# Patient Record
Sex: Female | Born: 2000 | Race: Black or African American | Hispanic: No | Marital: Single | Smoking: Current some day smoker
Health system: Southern US, Community
[De-identification: ages and names within clinical notes are randomized; demographics above are authoritative.]

## PROBLEM LIST (undated history)

## (undated) DIAGNOSIS — A749 Chlamydial infection, unspecified: Secondary | ICD-10-CM

## (undated) DIAGNOSIS — A599 Trichomoniasis, unspecified: Secondary | ICD-10-CM

## (undated) DIAGNOSIS — F909 Attention-deficit hyperactivity disorder, unspecified type: Secondary | ICD-10-CM

## (undated) DIAGNOSIS — F913 Oppositional defiant disorder: Secondary | ICD-10-CM

## (undated) DIAGNOSIS — A549 Gonococcal infection, unspecified: Secondary | ICD-10-CM

## (undated) DIAGNOSIS — F319 Bipolar disorder, unspecified: Secondary | ICD-10-CM

## (undated) DIAGNOSIS — R440 Auditory hallucinations: Secondary | ICD-10-CM

## (undated) DIAGNOSIS — J45909 Unspecified asthma, uncomplicated: Secondary | ICD-10-CM

---
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
## 2018-01-16 ENCOUNTER — Encounter (HOSPITAL_COMMUNITY): Payer: Self-pay | Admitting: Emergency Medicine

## 2018-01-16 ENCOUNTER — Ambulatory Visit (HOSPITAL_COMMUNITY)
Admission: EM | Admit: 2018-01-16 | Discharge: 2018-01-16 | Disposition: A | Discharge status: Home / Self Care | Payer: Medicaid Other | Attending: Family Medicine | Admitting: Family Medicine

## 2018-01-16 DIAGNOSIS — Z3202 Encounter for pregnancy test, result negative: Secondary | ICD-10-CM

## 2018-01-16 DIAGNOSIS — Z113 Encounter for screening for infections with a predominantly sexual mode of transmission: Secondary | ICD-10-CM | POA: Diagnosis not present

## 2018-01-16 DIAGNOSIS — K5901 Slow transit constipation: Secondary | ICD-10-CM | POA: Diagnosis not present

## 2018-01-16 DIAGNOSIS — F913 Oppositional defiant disorder: Secondary | ICD-10-CM | POA: Insufficient documentation

## 2018-01-16 DIAGNOSIS — N938 Other specified abnormal uterine and vaginal bleeding: Secondary | ICD-10-CM | POA: Diagnosis not present

## 2018-01-16 DIAGNOSIS — F3160 Bipolar disorder, current episode mixed, unspecified: Secondary | ICD-10-CM | POA: Diagnosis not present

## 2018-01-16 DIAGNOSIS — K5900 Constipation, unspecified: Secondary | ICD-10-CM

## 2018-01-16 DIAGNOSIS — N939 Abnormal uterine and vaginal bleeding, unspecified: Secondary | ICD-10-CM | POA: Insufficient documentation

## 2018-01-16 DIAGNOSIS — G4733 Obstructive sleep apnea (adult) (pediatric): Secondary | ICD-10-CM | POA: Insufficient documentation

## 2018-01-16 DIAGNOSIS — F909 Attention-deficit hyperactivity disorder, unspecified type: Secondary | ICD-10-CM | POA: Insufficient documentation

## 2018-01-16 DIAGNOSIS — J45909 Unspecified asthma, uncomplicated: Secondary | ICD-10-CM | POA: Diagnosis not present

## 2018-01-16 DIAGNOSIS — Z79899 Other long term (current) drug therapy: Secondary | ICD-10-CM | POA: Insufficient documentation

## 2018-01-16 LAB — POCT I-STAT, CHEM 8
BUN: 10 mg/dL (ref 6–20)
CALCIUM ION: 1.15 mmol/L (ref 1.15–1.40)
CHLORIDE: 107 mmol/L (ref 101–111)
Creatinine, Ser: 0.7 mg/dL (ref 0.44–1.00)
Glucose, Bld: 92 mg/dL (ref 65–99)
HCT: 36 % (ref 36.0–46.0)
Hemoglobin: 12.2 g/dL (ref 12.0–15.0)
Potassium: 4.6 mmol/L (ref 3.5–5.1)
SODIUM: 141 mmol/L (ref 135–145)
TCO2: 23 mmol/L (ref 22–32)

## 2018-01-16 LAB — POCT URINALYSIS DIP (DEVICE)
Bilirubin Urine: NEGATIVE
GLUCOSE, UA: NEGATIVE mg/dL
Hgb urine dipstick: NEGATIVE
LEUKOCYTES UA: NEGATIVE
Nitrite: NEGATIVE
Protein, ur: NEGATIVE mg/dL
SPECIFIC GRAVITY, URINE: 1.025 (ref 1.005–1.030)
UROBILINOGEN UA: 2 mg/dL — AB (ref 0.0–1.0)
pH: 7 (ref 5.0–8.0)

## 2018-01-16 LAB — POCT PREGNANCY, URINE: PREG TEST UR: NEGATIVE

## 2018-01-16 MED ORDER — POLYETHYLENE GLYCOL 3350 17 GM/SCOOP PO POWD: 17.0000 g | Freq: Every day | ORAL | 0 refills | Status: DC

## 2018-01-16 NOTE — ED Provider Notes (Signed)
[facility]    CSN: [id number] Arrival date & time: 01/16/18  1959     History   Chief Complaint Chief Complaint  Patient presents with  . SEXUALLY TRANSMITTED DISEASE    HPI Rebekah Sanders is a 17 y.o. female.   Rebekah Sanders presents with requests for STD screen. She states "something isn't right" to vagina. Unknown if any discharge as she has frequent vaginal bleeding. This has been ongoing for over a year since her nexplanon was changed. She has had chlamydia in the past. States she has "1.5" partners currently. States she does use condoms. Bleeding has been more heavy than usual for her. Does not have a PCP or gynecologist. Denies  Urinary symptoms. Endorses constipation. No fevers. No vulvar sores or lesions. Occasional pelvic pressure. Hx of adhd, asthma, bipolar.    ROS per HPI.      Past Medical History:  Diagnosis Date  . ADHD (attention deficit hyperactivity disorder)   . Asthma   . Auditory hallucinations   . Bipolar 1 disorder (HCC)   . Oppositional defiant disorder     Patient Active Problem List   Diagnosis Date Noted  . OSA (obstructive sleep apnea) 05/26/2016  . Presence of subdermal contraceptive implant 03/13/2013  . Screening for STD (sexually transmitted disease) 03/13/2013  . Bipolar I disorder, most recent episode (or current) mixed, severe, without mention of psychotic behavior 08/08/2012    Class: Chronic  . ODD (oppositional defiant disorder) 08/08/2012    Class: Chronic  . ADHD (attention deficit hyperactivity disorder), combined type 08/08/2012    Past Surgical History:  Procedure Laterality Date  . TONSILLECTOMY AND ADENOIDECTOMY Bilateral 03/2016    OB History   None      Home Medications    Prior to Admission medications   Medication Sig Start Date End Date Taking? Authorizing Provider  etonogestrel (NEXPLANON) 68 MG IMPL implant 1 each by Subdermal route once.   Yes [provider]  megestrol (MEGACE) 40 MG  tablet Take 1 tablet (40 mg total) by mouth 2 (two) times daily. 10/25/17   Elvina Sidle, MD  metroNIDAZOLE (FLAGYL) 500 MG tablet Take 1 tablet (500 mg total) by mouth 2 (two) times daily. 10/27/17   Isa Rankin, MD  polyethylene glycol powder [facility]) powder Take 17 g by mouth daily. 01/16/18   Georgetta Haber, NP    Family History Family History  Problem Relation Age of Onset  . Bipolar disorder Unknown        Maternal grandparents  . Depression Unknown        Parents  . Drug abuse Unknown        multiple family members by report    Social History Social History   Tobacco Use  . Smoking status: Never Smoker  . Smokeless tobacco: Never Used  . Tobacco comment: father smokes in home  Substance Use Topics  . Alcohol use: No    Alcohol/week: 0.0 oz    Comment: Pt denies   . Drug use: No    Comment: Pt denies     Allergies   Penicillins   Review of Systems Review of Systems   Physical Exam Triage Vital Signs ED Triage Vitals [01/16/18 2018]  Enc Vitals Group     BP (!) 154/57     Pulse Rate 93     Resp 16     Temp 98.7 F (37.1 C)     Temp Source Oral     SpO2 100 %  Weight      Height      Head Circumference      Peak Flow      Pain Score 7     Pain Loc      Pain Edu?      Excl. in GC?    No data found.  Updated Vital Signs BP (!) 154/57   Pulse 93   Temp 98.7 F (37.1 C) (Oral)   Resp 16   SpO2 100%   Visual Acuity Right Eye Distance:   Left Eye Distance:   Bilateral Distance:    Right Eye Near:   Left Eye Near:    Bilateral Near:     Physical Exam  Constitutional: She is oriented to person, place, and time. She appears well-developed and well-nourished. No distress.  Cardiovascular: Normal rate, regular rhythm and normal heart sounds.  Pulmonary/Chest: Effort normal and breath sounds normal.  Abdominal: Soft. There is no tenderness.  Genitourinary:  Genitourinary Comments: Vaginal bleeding for over a year;  denies sores, lesions; discharge/itching; gu deferred at this time   Neurological: She is alert and oriented to person, place, and time.  Skin: Skin is warm and dry.     UC Treatments / Results  Labs (all labs ordered are listed, but only abnormal results are displayed) Labs Reviewed  RPR  HIV ANTIBODY (ROUTINE TESTING)  URINE CYTOLOGY ANCILLARY ONLY    EKG None  Radiology No results found.  Procedures Procedures (including critical care time)  Medications Ordered in UC Medications - No data to display  Initial Impression / Assessment and Plan / UC Course  I have reviewed the triage vital signs and the nursing notes.  Pertinent labs & imaging results that were available during my care of the patient were reviewed by me and considered in my medical decision making (see chart for details).     Chem 8 normal at this time, reassuring h&h with abnormal bleeding. Urine dip and pregnancy negative. Urine cytology pending as well as RPR/HIV. Will notify of any positive findings and if any changes to treatment are needed.  Establish with gynecology for recheck. Patient verbalized understanding and agreeable to plan.   Final Clinical Impressions(s) / UC Diagnoses   Final diagnoses:  Screen for STD (sexually transmitted disease)  Abnormal vaginal bleeding  Slow transit constipation     Discharge Instructions     Please use condoms to prevent STD's. We will notify you by phone of any positive findings from your urine or blood testing.  You may also check on your MyChart. Miralax as needed for constipation. Increase fluids and fiber in your diet to help with this.  Please follow up/establish with a gynecologist in regards to your frequent vaginal bleeding.    ED Prescriptions    Medication Sig Dispense Auth. Provider   polyethylene glycol powder (GLYCOLAX/MIRALAX) powder Take 17 g by mouth daily. 255 g Georgetta HaberBurky, Cadynce Garrette B, NP     Controlled Substance Prescriptions [place]  Controlled Substance Registry consulted? Not Applicable   Georgetta HaberBurky, Tranise Forrest B, NP 01/16/18 2101

## 2018-01-16 NOTE — ED Triage Notes (Signed)
PT requests STD check. PT reports constant menstrual since nexplanon was changed in April.

## 2018-01-16 NOTE — Discharge Instructions (Addendum)
Please use condoms to prevent STD's. We will notify you by phone of any positive findings from your urine or blood testing.  You may also check on your MyChart. Miralax as needed for constipation. Increase fluids and fiber in your diet to help with this.  Please follow up/establish with a gynecologist in regards to your frequent vaginal bleeding.

## 2018-01-17 ENCOUNTER — Telehealth (HOSPITAL_COMMUNITY): Payer: Self-pay

## 2018-01-17 LAB — URINE CYTOLOGY ANCILLARY ONLY
Chlamydia: NEGATIVE
Neisseria Gonorrhea: POSITIVE — AB
TRICH (WINDOWPATH): NEGATIVE

## 2018-01-17 LAB — RPR: RPR Ser Ql: NONREACTIVE

## 2018-01-17 LAB — HIV ANTIBODY (ROUTINE TESTING W REFLEX): HIV Screen 4th Generation wRfx: NONREACTIVE

## 2018-01-17 NOTE — Telephone Encounter (Signed)
Gonorrhea is positive.  Patient should return as soon as possible to the urgent care for treatment with IM rocephin 250mg  and po zithromax 1g. Patient will not need to see a provider unless there are new symptoms she would like evaluated. Need to educate patient to refrain from sexual intercourse for now and for 7 days after treatment to give the medicine time to work. Sexual partners need to be notified and tested/treated. Condoms may reduce risk of reinfection. Answered all patient questions. [facility] notified. Attempted to reach patient.  Number provided is not correct. Patient mailed letter and health department aware that the patient has not been notified or treated.

## 2018-01-19 LAB — URINE CYTOLOGY ANCILLARY ONLY: Candida vaginitis: NEGATIVE

## 2018-01-23 ENCOUNTER — Telehealth (HOSPITAL_COMMUNITY): Payer: Self-pay

## 2018-01-23 NOTE — Telephone Encounter (Signed)
Pt returned to clinic to get results. Pt is aware of positive Gonorrhea results and need for treatment. States she will come back this evening for treatment.

## 2018-01-24 ENCOUNTER — Encounter (HOSPITAL_COMMUNITY): Payer: Self-pay

## 2018-01-24 ENCOUNTER — Ambulatory Visit (HOSPITAL_COMMUNITY)
Admission: EM | Admit: 2018-01-24 | Discharge: 2018-01-24 | Disposition: A | Discharge status: Home / Self Care | Payer: Medicaid Other | Attending: Family Medicine | Admitting: Family Medicine

## 2018-01-24 DIAGNOSIS — A549 Gonococcal infection, unspecified: Secondary | ICD-10-CM

## 2018-01-24 MED ORDER — AZITHROMYCIN 250 MG PO TABS
ORAL_TABLET | ORAL | Status: AC
  Filled 2018-01-24: qty 4

## 2018-01-24 MED ORDER — CEFTRIAXONE SODIUM 250 MG IJ SOLR
250.0000 mg | Freq: Once | INTRAMUSCULAR | Status: AC
  Administered 2018-01-24: 250 mg via INTRAMUSCULAR

## 2018-01-24 MED ORDER — LIDOCAINE HCL (PF) 1 % IJ SOLN
INTRAMUSCULAR | Status: AC
  Filled 2018-01-24: qty 2

## 2018-01-24 MED ORDER — CEFTRIAXONE SODIUM 250 MG IJ SOLR
INTRAMUSCULAR | Status: AC
  Filled 2018-01-24: qty 250

## 2018-01-24 MED ORDER — AZITHROMYCIN 250 MG PO TABS
1000.0000 mg | ORAL_TABLET | Freq: Once | ORAL | Status: AC
  Administered 2018-01-24: 1000 mg via ORAL

## 2018-01-24 NOTE — ED Triage Notes (Signed)
Pt presents to get treatment for STD Screening

## 2018-01-25 ENCOUNTER — Ambulatory Visit: Payer: Medicaid Other | Admitting: Family

## 2018-02-21 ENCOUNTER — Encounter: Payer: Medicaid Other | Admitting: Licensed Clinical Social Worker

## 2018-02-21 ENCOUNTER — Ambulatory Visit: Payer: Medicaid Other | Admitting: Pediatrics

## 2018-02-21 NOTE — ED Provider Notes (Signed)
[facility]    CSN: [id number] Arrival date & time: 01/24/18  1458     History   Chief Complaint Chief Complaint  Patient presents with  . Follow Up for STD results, just getting medication    HPI Rebekah Sanders is a 17 y.o. female.   Patient is here for treatment of positive cytologies.  This is a nurse visit.  HPI  Past Medical History:  Diagnosis Date  . ADHD (attention deficit hyperactivity disorder)   . Asthma   . Auditory hallucinations   . Bipolar 1 disorder (HCC)   . Oppositional defiant disorder     Patient Active Problem List   Diagnosis Date Noted  . OSA (obstructive sleep apnea) 05/26/2016  . Presence of subdermal contraceptive implant 03/13/2013  . Screening for STD (sexually transmitted disease) 03/13/2013  . Bipolar I disorder, most recent episode (or current) mixed, severe, without mention of psychotic behavior 08/08/2012    Class: Chronic  . ODD (oppositional defiant disorder) 08/08/2012    Class: Chronic  . ADHD (attention deficit hyperactivity disorder), combined type 08/08/2012    Past Surgical History:  Procedure Laterality Date  . TONSILLECTOMY AND ADENOIDECTOMY Bilateral 03/2016    OB History   None      Home Medications    Prior to Admission medications   Medication Sig Start Date End Date Taking? Authorizing Provider  etonogestrel (NEXPLANON) 68 MG IMPL implant 1 each by Subdermal route once.   Yes [provider]  megestrol (MEGACE) 40 MG tablet Take 1 tablet (40 mg total) by mouth 2 (two) times daily. 10/25/17   Elvina Sidle, MD  metroNIDAZOLE (FLAGYL) 500 MG tablet Take 1 tablet (500 mg total) by mouth 2 (two) times daily. 10/27/17   Isa Rankin, MD  polyethylene glycol powder [facility]) powder Take 17 g by mouth daily. 01/16/18   Georgetta Haber, NP    Family History Family History  Problem Relation Age of Onset  . Bipolar disorder Unknown        Maternal grandparents  . Depression  Unknown        Parents  . Drug abuse Unknown        multiple family members by report    Social History Social History   Tobacco Use  . Smoking status: Never Smoker  . Smokeless tobacco: Never Used  . Tobacco comment: father smokes in home  Substance Use Topics  . Alcohol use: No    Alcohol/week: 0.0 oz    Comment: Pt denies   . Drug use: No    Comment: Pt denies     Allergies   Penicillins   Review of Systems Review of Systems   Physical Exam Triage Vital Signs ED Triage Vitals [01/24/18 1511]  Enc Vitals Group     BP      Pulse      Resp      Temp      Temp src      SpO2      Weight      Height      Head Circumference      Peak Flow      Pain Score 0     Pain Loc      Pain Edu?      Excl. in GC?    No data found.  Updated Vital Signs There were no vitals taken for this visit.  Visual Acuity Right Eye Distance:   Left Eye Distance:   Bilateral  Distance:    Right Eye Near:   Left Eye Near:    Bilateral Near:     Physical Exam   UC Treatments / Results  Labs (all labs ordered are listed, but only abnormal results are displayed) Labs Reviewed - No data to display  EKG None  Radiology No results found.  Procedures Procedures (including critical care time)  Medications Ordered in UC Medications  cefTRIAXone (ROCEPHIN) injection 250 mg (250 mg Intramuscular Given 01/24/18 1525)  azithromycin (ZITHROMAX) tablet 1,000 mg (1,000 mg Oral Given 01/24/18 1524)    Initial Impression / Assessment and Plan / UC Course  I have reviewed the triage vital signs and the nursing notes.  Pertinent labs & imaging results that were available during my care of the patient were reviewed by me and considered in my medical decision making (see chart for details).     Gonorrhea, proven by culture Final Clinical Impressions(s) / UC Diagnoses   Final diagnoses:  None   Discharge Instructions   None    ED Prescriptions    None     Controlled  Substance Prescriptions [place] Controlled Substance Registry consulted? No   Frederica KusterMiller, Mishal Probert M, MD 02/21/18 [id number]

## 2018-02-22 ENCOUNTER — Ambulatory Visit: Payer: Medicaid Other | Admitting: Family

## 2018-03-02 ENCOUNTER — Encounter: Payer: Self-pay | Admitting: Pediatrics

## 2018-03-02 ENCOUNTER — Ambulatory Visit (INDEPENDENT_AMBULATORY_CARE_PROVIDER_SITE_OTHER): Payer: Medicaid Other | Admitting: Pediatrics

## 2018-03-02 VITALS — BP 101/62 | HR 83 | Ht 66.93 in | Wt 193.8 lb

## 2018-03-02 DIAGNOSIS — R790 Abnormal level of blood mineral: Secondary | ICD-10-CM | POA: Diagnosis not present

## 2018-03-02 DIAGNOSIS — Z3046 Encounter for surveillance of implantable subdermal contraceptive: Secondary | ICD-10-CM | POA: Diagnosis not present

## 2018-03-02 DIAGNOSIS — F3163 Bipolar disorder, current episode mixed, severe, without psychotic features: Secondary | ICD-10-CM

## 2018-03-02 DIAGNOSIS — Z8619 Personal history of other infectious and parasitic diseases: Secondary | ICD-10-CM

## 2018-03-02 DIAGNOSIS — Z113 Encounter for screening for infections with a predominantly sexual mode of transmission: Secondary | ICD-10-CM

## 2018-03-02 DIAGNOSIS — L700 Acne vulgaris: Secondary | ICD-10-CM | POA: Diagnosis not present

## 2018-03-02 DIAGNOSIS — L739 Follicular disorder, unspecified: Secondary | ICD-10-CM

## 2018-03-02 MED ORDER — FERROUS SULFATE 325 (65 FE) MG PO TABS: 325.0000 mg | ORAL_TABLET | Freq: Two times a day (BID) | ORAL | 3 refills | Status: DC

## 2018-03-02 MED ORDER — CLINDAMYCIN PHOS-BENZOYL PEROX 1-5 % EX GEL: Freq: Two times a day (BID) | CUTANEOUS | 3 refills | Status: DC

## 2018-03-02 MED ORDER — ADAPALENE 0.1 % EX CREA: TOPICAL_CREAM | Freq: Every day | CUTANEOUS | 3 refills | Status: DC

## 2018-03-02 NOTE — Progress Notes (Signed)
History was provided by the patient.  Rebekah LeatherwoodVicky Sanders is a 17 y.o. female who is here for nexplanon removal, STI testing.  Patient, No Pcp Per   HPI:  Pt reports that she wants to take her birth control out. She feels that it is painful at the site. Says that mom and sister say that pills are better. She has had quite a bit of bleeding with her nexplanon as well, which she has not liked. She feels that she can take the pills daily and really does not want to be pregnant. She has not considered an IUD but in discussion feels anxious about the placement.   Says that her boyfriend of 2 months has noted that she needs to be seen for her mood. She can't say what he finds about her mood that is concerning, but is willing to bring him back for a f/u visit next week with her to discuss.   She would like some cream for her acne- has been using sister's benzaclin and differin which has been working well.   Has folliculitis from shaving in vaginal area. She got treated for gonorrhea in June and reports she is no longer with that partner. She was positive for trich in April and can't remember exactly if she completed treatment for this. Requests repeat testing today. Denies current sx.    No LMP recorded. Patient has had an implant.  Review of Systems  Constitutional: Positive for malaise/fatigue.  Eyes: Negative for double vision.  Respiratory: Negative for shortness of breath.   Cardiovascular: Negative for chest pain and palpitations.  Gastrointestinal: Negative for abdominal pain, constipation, diarrhea, nausea and vomiting.  Genitourinary: Negative for dysuria.  Musculoskeletal: Negative for joint pain and myalgias.  Skin: Negative for rash.  Neurological: Negative for dizziness and headaches.  Endo/Heme/Allergies: Does not bruise/bleed easily.  Psychiatric/Behavioral: Negative for depression. The patient is not nervous/anxious and does not have insomnia.     Patient Active Problem List   Diagnosis Date Noted  . OSA (obstructive sleep apnea) 05/26/2016  . Presence of subdermal contraceptive implant 03/13/2013  . Screening for STD (sexually transmitted disease) 03/13/2013  . Bipolar I disorder, most recent episode (or current) mixed, severe, without mention of psychotic behavior 08/08/2012    Class: Chronic  . ODD (oppositional defiant disorder) 08/08/2012    Class: Chronic  . ADHD (attention deficit hyperactivity disorder), combined type 08/08/2012    Current Outpatient Medications on File Prior to Visit  Medication Sig Dispense Refill  . etonogestrel (NEXPLANON) 68 MG IMPL implant 1 each by Subdermal route once.    . megestrol (MEGACE) 40 MG tablet Take 1 tablet (40 mg total) by mouth 2 (two) times daily. (Patient not taking: Reported on 03/02/2018) 14 tablet 0  . metroNIDAZOLE (FLAGYL) 500 MG tablet Take 1 tablet (500 mg total) by mouth 2 (two) times daily. (Patient not taking: Reported on 03/02/2018) 14 tablet 0  . polyethylene glycol powder (GLYCOLAX/MIRALAX) powder Take 17 g by mouth daily. (Patient not taking: Reported on 03/02/2018) 255 g 0   No current facility-administered medications on file prior to visit.     Allergies  Allergen Reactions  . Penicillins Hives     Physical Exam:    Vitals:   03/02/18 1526  BP: 101/62  Pulse: 83  Weight: 193 lb 12.8 oz (87.9 kg)  Height: 5' 6.93" (1.7 m)    Blood pressure percentiles are not available for patients who are 18 years or older.  Physical Exam  Constitutional:  She appears well-developed. No distress.  HENT:  Mouth/Throat: Oropharynx is clear and moist.  Neck: No thyromegaly present.  Cardiovascular: Normal rate and regular rhythm.  No murmur heard. Pulmonary/Chest: Breath sounds normal.  Abdominal: Soft. She exhibits no mass. There is no tenderness. There is no guarding.  Musculoskeletal: She exhibits no edema.  Lymphadenopathy:    She has no cervical adenopathy.  Neurological: She is alert.  Skin:  Skin is warm. No rash noted.  Psychiatric: She has a normal mood and affect.  Nursing note and vitals reviewed.   Assessment/Plan: 1. Encounter for Nexplanon removal See procedure note for removal. Tolerated well. OCP sent to pharmacy and provided handout about how to take appropriately.   2. Severe bipolar I disorder, current or most recent episode mixed (HCC) Will return next week for further discussion. Also needs to get established with an adult provider. Provided family medicine phone number. Discussed she can continue to see me until she gets established, but should stop using ED and urgent care so frequently for STI testing.   3. Low ferritin Last check of ferrintin was 2. She never took iron tablets. She continues to complain of fatigue. Iron BID. Discussed dark stools and constipation as potential s/e. Will recheck if she continues good compliance.  - ferrous sulfate (FERROUSUL) 325 (65 FE) MG tablet; Take 1 tablet (325 mg total) by mouth 2 (two) times daily with a meal.  Dispense: 60 tablet; Refill: 3  4. Acne vulgaris rx for acne cream. Discussed that OCP may also help.  - clindamycin-benzoyl peroxide (BENZACLIN) gel; Apply topically 2 (two) times daily.  Dispense: 50 g; Refill: 3 - adapalene (DIFFERIN) 0.1 % cream; Apply topically at bedtime.  Dispense: 45 g; Refill: 3  5. Folliculitis Can use benzaclin for this as well. Denies any s/sx that sound like herpes. If still present and bothersome next week, will look at potentially swab.  - clindamycin-benzoyl peroxide (BENZACLIN) gel; Apply topically 2 (two) times daily.  Dispense: 50 g; Refill: 3  6. History of trichomoniasis Repeat swab today.  - WET PREP BY MOLECULAR PROBE  7. Routine screening for STI (sexually transmitted infection) Repeat testing today.  - C. trachomatis/N. gonorrhoeae RNA

## 2018-03-02 NOTE — Patient Instructions (Addendum)
[facility]  [phone] Please call and make an appointment for primary care.  If you need to be seen for a mood symptom, vaginal symptom or birth control concern, you can come here and see [place] in red pod. Please activate mychart so you can send [place] a message with your concern and we can get you scheduled appropriately. Try to avoid using the ER and urgent care when possible.   For razor bumps- 1. Use witch hazel after you shave and daily to help prevent razor bumps. 2. Use some sort of exfoliation in the shower- sugar scrub or similar. 3. Use a new disposable razor every time you shave. 4. Use the benzaclin cream on the area to help prevent bumps.   Take iron twice a day with food. It may make your stools black.   Come back in 1 week and let's talk about your mood symptoms.   Follow-up with Dr. Marina Goodell in 1 month. Schedule this appointment before you leave clinic today.  Your Nexplanon was removed today and is no longer preventing pregnancy.  If you have sex, remember to use condoms to prevent pregnancy and to prevent sexually transmitted infections.  Leave the outside bandage on for 24 hours.  Leave the smaller bandages on for 3-5 days or until they fall off on their own.  Keep the area clean and dry for 3-5 days.  There is usually bruising or swelling at and around the removal site for a few days to a week after the removal.  If you see redness or pus draining from the removal site, call us immediately.  We would like you to return to the clinic for a follow-up visit in 1 month.  You can call [facility] 24 hours a day with any questions or concerns.  There is always a nurse or doctor available to take your call.  Call 9-1-1 if you have a life-threatening emergency.  For anything else, please call us at [phone] before heading to the ER.  Oral Contraception Use Oral contraceptive pills (OCPs) are medicines taken to prevent pregnancy. OCPs work by  preventing the ovaries from releasing eggs. The hormones in OCPs also cause the cervical mucus to thicken, preventing the sperm from entering the uterus. The hormones also cause the uterine lining to become thin, not allowing a fertilized egg to attach to the inside of the uterus. OCPs are highly effective when taken exactly as prescribed. However, OCPs do not prevent sexually transmitted diseases (STDs). Safe sex practices, such as using condoms along with an OCP, can help prevent STDs. Before taking OCPs, you may have a physical exam and Pap test. Your health care provider may also order blood tests if necessary. Your health care provider will make sure you are a good candidate for oral contraception. Discuss with your health care provider the possible side effects of the OCP you may be prescribed. When starting an OCP, it can take 2 to 3 months for the body to adjust to the changes in hormone levels in your body. How to take oral contraceptive pills Your health care provider may advise you on how to start taking the first cycle of OCPs. Otherwise, you can:  Start on day 1 of your menstrual period. You will not need any backup contraceptive protection with this start time.  Start on the first Sunday after your menstrual period or the day you get your prescription. In these cases, you will need to use backup  contraceptive protection for the first week.  Start the pill at any time of your cycle. If you take the pill within 5 days of the start of your period, you are protected against pregnancy right away. In this case, you will not need a backup form of birth control. If you start at any other time of your menstrual cycle, you will need to use another form of birth control for 7 days. If your OCP is the type called a minipill, it will protect you from pregnancy after taking it for 2 days (48 hours).  After you have started taking OCPs:  If you forget to take 1 pill, take it as soon as you remember. Take  the next pill at the regular time.  If you miss 2 or more pills, call your health care provider because different pills have different instructions for missed doses. Use backup birth control until your next menstrual period starts.  If you use a 28-day pack that contains inactive pills and you miss 1 of the last 7 pills (pills with no hormones), it will not matter. Throw away the rest of the non-hormone pills and start a new pill pack.  No matter which day you start the OCP, you will always start a new pack on that same day of the week. Have an extra pack of OCPs and a backup contraceptive method available in case you miss some pills or lose your OCP pack. Follow these instructions at home:  Do not smoke.  Always use a condom to protect against STDs. OCPs do not protect against STDs.  Use a calendar to mark your menstrual period days.  Read the information and directions that came with your OCP. Talk to your health care provider if you have questions. Contact a health care provider if:  You develop nausea and vomiting.  You have abnormal vaginal discharge or bleeding.  You develop a rash.  You miss your menstrual period.  You are losing your hair.  You need treatment for mood swings or depression.  You get dizzy when taking the OCP.  You develop acne from taking the OCP.  You become pregnant. Get help right away if:  You develop chest pain.  You develop shortness of breath.  You have an uncontrolled or severe headache.  You develop numbness or slurred speech.  You develop visual problems.  You develop pain, redness, and swelling in the legs. This information is not intended to replace advice given to you by your health care provider. Make sure you discuss any questions you have with your health care provider. Document Released: 07/01/2011 Document Revised: 12/18/2015 Document Reviewed: 12/31/2012 Elsevier Interactive Patient Education  2017 [employer]

## 2018-03-02 NOTE — Progress Notes (Signed)
Patient cell phone [phone].

## 2018-03-03 DIAGNOSIS — L700 Acne vulgaris: Secondary | ICD-10-CM | POA: Insufficient documentation

## 2018-03-03 DIAGNOSIS — R790 Abnormal level of blood mineral: Secondary | ICD-10-CM | POA: Insufficient documentation

## 2018-03-03 DIAGNOSIS — Z8619 Personal history of other infectious and parasitic diseases: Secondary | ICD-10-CM | POA: Insufficient documentation

## 2018-03-03 LAB — C. TRACHOMATIS/N. GONORRHOEAE RNA
C. TRACHOMATIS RNA, TMA: NOT DETECTED
N. GONORRHOEAE RNA, TMA: NOT DETECTED

## 2018-03-03 MED ORDER — NORETHIN ACE-ETH ESTRAD-FE 1.5-30 MG-MCG PO TABS: 1.0000 | ORAL_TABLET | Freq: Every day | ORAL | 11 refills | Status: DC

## 2018-03-03 NOTE — Progress Notes (Signed)

## 2018-03-04 LAB — WET PREP BY MOLECULAR PROBE
CANDIDA SPECIES: NOT DETECTED
MICRO NUMBER:: 90945860
SPECIMEN QUALITY:: ADEQUATE
Trichomonas vaginosis: DETECTED — AB

## 2018-03-07 ENCOUNTER — Encounter: Payer: Medicaid Other | Admitting: Clinical

## 2018-03-07 ENCOUNTER — Ambulatory Visit: Payer: Medicaid Other | Admitting: Pediatrics

## 2018-03-07 NOTE — BH Specialist Note (Deleted)
Integrated Behavioral Health Initial Visit  MRN: [id number] Name: Rebekah Sanders  Number of Integrated Behavioral Health Clinician visits:: 1/6 Session Start time: ***  Session End time: *** Total time: {IBH Total Time:21014050}  Type of Service: Integrated Behavioral Health- Individual/Family Interpretor:{yes NU:[id number]}no:[id number]} Interpretor Name and Language: ***   Warm Hand Off Completed.       SUBJECTIVE: Rebekah Sanders is a 17 y.o. female accompanied by {CHL AMB ACCOMPANIED UY:[id number]}BY:[phone]} Patient was referred by *** for ***. Patient reports the following symptoms/concerns: *** Duration of problem: ***; Severity of problem: {Mild/Moderate/Severe:20260}  OBJECTIVE: Mood: {BHH MOOD:22306} and Affect: {BHH AFFECT:22307} Risk of harm to self or others: {CHL AMB BH Suicide Current Mental Status:21022748}  LIFE CONTEXT: Family and Social: *** School/Work: *** Self-Care: *** Life Changes: ***  GOALS ADDRESSED: Patient will: 1. Reduce symptoms of: {IBH Symptoms:21014056} 2. Increase knowledge and/or ability of: {IBH Patient Tools:21014057}  3. Demonstrate ability to: {IBH Goals:21014053}  INTERVENTIONS: Interventions utilized: {IBH Interventions:21014054}  Standardized Assessments completed: {IBH Screening Tools:21014051}  ASSESSMENT: Patient currently experiencing ***.   Patient may benefit from ***.  PLAN: 1. Follow up with behavioral health clinician on : *** 2. Behavioral recommendations: *** 3. Referral(s): {IBH Referrals:21014055} 4. "From scale of 1-10, how likely are you to follow plan?": ***  Gordy SaversJasmine P Williams, LCSW

## 2018-03-08 ENCOUNTER — Other Ambulatory Visit: Payer: Self-pay | Admitting: Pediatrics

## 2018-03-08 MED ORDER — METRONIDAZOLE 500 MG PO TABS: 500.0000 mg | ORAL_TABLET | Freq: Two times a day (BID) | ORAL | 0 refills | Status: AC

## 2018-03-08 MED ORDER — NORETHIN ACE-ETH ESTRAD-FE 1.5-30 MG-MCG PO TABS: 1.0000 | ORAL_TABLET | Freq: Every day | ORAL | 11 refills | Status: DC

## 2018-03-08 NOTE — Progress Notes (Signed)
Patient called to inquire about missed call. Also wanted her BCP sent to [facility] on [address].  Gave her results as per previous notes. Patient elected to have her medicine called in to pharmacy and NOT come to clinic. Patient was made aware that we could not treat partner if she did not come to clinic. Told her I would give information to provider and verified her phone number.

## 2018-03-08 NOTE — Progress Notes (Signed)
TC to patient. Resent birth  Control to preferred pharmacy and explained treatment for trich. I was unable to LVM.

## 2018-03-15 ENCOUNTER — Telehealth: Payer: Self-pay | Admitting: General Practice

## 2018-03-15 NOTE — Telephone Encounter (Signed)
Called number on file, no answer, left VM to call office back. ° °

## 2018-03-15 NOTE — Telephone Encounter (Signed)
This is not an emergency matter. Rebekah Sanders did not come back for her last appointment to discuss her mood concerns. Has she picked up medication for trich? She will need to be seen in the office to discuss this matter as we have never treated her for mood in the past.

## 2018-03-15 NOTE — Telephone Encounter (Signed)
Patient called and stated she would like Solwayhristy or [place] CitizenCaroline to please call her, somewhat an emergency. She needs a letter stating her dog is a therapy dog.

## 2018-03-20 NOTE — Telephone Encounter (Signed)
Patient called again to discuss construction of letter. Explained she would need to be seen in the office to follow up on mood before anything can be constructed. Gave soonest avail pm appointment and explained repercussion of no show. Also spoke with her on STI treatment. Gave medication treatment instructions. She has not picked up medication but plans to get it today from [facility] [address].

## 2018-03-21 ENCOUNTER — Ambulatory Visit (HOSPITAL_COMMUNITY)
Admission: EM | Admit: 2018-03-21 | Discharge: 2018-03-21 | Disposition: A | Discharge status: Home / Self Care | Payer: Medicaid Other | Attending: Family Medicine | Admitting: Family Medicine

## 2018-03-21 ENCOUNTER — Encounter (HOSPITAL_COMMUNITY): Payer: Self-pay

## 2018-03-21 DIAGNOSIS — K5900 Constipation, unspecified: Secondary | ICD-10-CM

## 2018-03-21 LAB — POCT PREGNANCY, URINE: Preg Test, Ur: NEGATIVE

## 2018-03-21 MED ORDER — POLYETHYLENE GLYCOL 3350 17 GM/SCOOP PO POWD: 1.0000 | Freq: Once | ORAL | 0 refills | Status: AC

## 2018-03-21 MED ORDER — MAGNESIUM CITRATE PO SOLN: 1.0000 | Freq: Once | ORAL | 1 refills | Status: AC

## 2018-03-21 NOTE — ED Provider Notes (Signed)
[facility]    CSN: [id number] Arrival date & time: 03/21/18  1456     History   Chief Complaint Chief Complaint  Patient presents with  . Abdominal Pain    HPI Rebekah Sanders is a 17 y.o. female.   Pt is a 17 year old female past medical history of ADHD, asthma, bipolar, trichomoniasis, gonorrhea, chlamydia. She presents with left-sided abdominal pain and left lower quadrant pain x1 week.  The problem has been waxing and waning.  It has worsened.  She had a BM this morning that was small and hard.  She does have a history of constipation.  Takes iron supplement.  She denies any rectal pain, rectal bleeding. She denies any nausea vomiting or diarrhea.  She denies any fever, chills, dysuria, hematuria, vaginal discharge, vaginal bleeding. LMP was 03/03/18 and normal. She is on OCs.   She does not smoke Family hx of bipolar, depression and drug use.    ROS per HPI       Past Medical History:  Diagnosis Date  . ADHD (attention deficit hyperactivity disorder)   . Asthma   . Auditory hallucinations   . Bipolar 1 disorder (HCC)   . Oppositional defiant disorder     Patient Active Problem List   Diagnosis Date Noted  . Low ferritin 03/03/2018  . Acne vulgaris 03/03/2018  . History of trichomoniasis 03/03/2018  . History of gonorrhea 03/03/2018  . History of chlamydia 03/03/2018  . OSA (obstructive sleep apnea) 05/26/2016  . Screening for STD (sexually transmitted disease) 03/13/2013  . Severe bipolar I disorder, current or most recent episode mixed (HCC) 08/08/2012    Class: Chronic  . ODD (oppositional defiant disorder) 08/08/2012    Class: Chronic  . ADHD (attention deficit hyperactivity disorder), combined type 08/08/2012    Past Surgical History:  Procedure Laterality Date  . TONSILLECTOMY AND ADENOIDECTOMY Bilateral 03/2016    OB History   None      Home Medications    Prior to Admission medications   Medication Sig Start Date End Date  Taking? Authorizing Provider  adapalene (DIFFERIN) 0.1 % cream Apply topically at bedtime. 03/02/18   Verneda Skill, FNP  clindamycin-benzoyl peroxide (BENZACLIN) gel Apply topically 2 (two) times daily. 03/02/18   Verneda Skill, FNP  etonogestrel (NEXPLANON) 68 MG IMPL implant 1 each by Subdermal route once.    [provider]  ferrous sulfate (FERROUSUL) 325 (65 FE) MG tablet Take 1 tablet (325 mg total) by mouth 2 (two) times daily with a meal. 03/02/18   Verneda Skill, FNP  magnesium citrate SOLN Take 296 mLs (1 Bottle total) by mouth once for 1 dose. 03/21/18 03/21/18  Dahlia Byes A, NP  norethindrone-ethinyl estradiol-iron (JUNEL FE 1.5/30) 1.5-30 MG-MCG tablet Take 1 tablet by mouth daily. 03/08/18   Verneda Skill, FNP  polyethylene glycol powder (MIRALAX) powder Take 255 g by mouth once for 1 dose. 03/21/18 03/21/18  Janace Aris, NP    Family History Family History  Problem Relation Age of Onset  . Bipolar disorder Unknown        Maternal grandparents  . Depression Unknown        Parents  . Drug abuse Unknown        multiple family members by report    Social History Social History   Tobacco Use  . Smoking status: Never Smoker  . Smokeless tobacco: Never Used  . Tobacco comment: father smokes in home  Substance Use Topics  .  Alcohol use: No    Alcohol/week: 0.0 standard drinks    Comment: Pt denies   . Drug use: No    Comment: Pt denies     Allergies   Penicillins   Review of Systems Review of Systems   Physical Exam Triage Vital Signs ED Triage Vitals [03/21/18 1515]  Enc Vitals Group     BP 126/79     Pulse Rate 95     Resp 20     Temp 98.4 F (36.9 C)     Temp Source Oral     SpO2 98 %     Weight      Height      Head Circumference      Peak Flow      Pain Score      Pain Loc      Pain Edu?      Excl. in GC?    No data found.  Updated Vital Signs BP 126/79 (BP Location: Right Arm)   Pulse 95   Temp 98.4 F (36.9 C)  (Oral)   Resp 20   LMP 03/03/2018   SpO2 98%   Visual Acuity Right Eye Distance:   Left Eye Distance:   Bilateral Distance:    Right Eye Near:   Left Eye Near:    Bilateral Near:     Physical Exam  Constitutional: She appears well-developed and well-nourished.  Very pleasant nontoxic or ill-appearing.  HENT:  Head: Normocephalic and atraumatic.  Pulmonary/Chest: Effort normal.  Abdominal: Soft. Normal appearance and bowel sounds are normal. There is tenderness in the left upper quadrant and left lower quadrant. There is no rebound and no CVA tenderness.  Soft, nontender.  No masses.  Negative rebound.  Tender to palpation of left upper quadrant and left lower quadrant.  Some fullness felt.   Neurological: She is alert.  Skin: Skin is warm and dry. Capillary refill takes less than 2 seconds.  Psychiatric: She has a normal mood and affect.  Nursing note and vitals reviewed.    UC Treatments / Results  Labs (all labs ordered are listed, but only abnormal results are displayed) Labs Reviewed  POCT PREGNANCY, URINE    EKG None  Radiology No results found.  Procedures Procedures (including critical care time)  Medications Ordered in UC Medications - No data to display  Initial Impression / Assessment and Plan / UC Course  I have reviewed the triage vital signs and the nursing notes.  Pertinent labs & imaging results that were available during my care of the patient were reviewed by me and considered in my medical decision making (see chart for details).     Most likely constipation Urine negative for infection or pregnancy. Will treat with mag citrate and daily MiraLax.  If no relief from this she may want to try an enema. Follow up as needed for continued or worsening symptoms  Final Clinical Impressions(s) / UC Diagnoses   Final diagnoses:  Constipation, unspecified constipation type     Discharge Instructions     It was nice meeting you!!  I Believe  that your abdominal pain is caused by constipation.  I am sending the mag citrate to your pharmacy Please consume the whole bottle.  You can continue daily MiraLAX after that to maintain bowel movements. If your bowel movements become too frequent or more loose you can decrease to few times a week. He can also try an enema over-the-counter if you get no relief from the  mag citrate. If you do not have a bowel movement or develop more severe abdominal pain, nausea, vomiting please go to the ER.      ED Prescriptions    Medication Sig Dispense Auth. Provider   magnesium citrate SOLN Take 296 mLs (1 Bottle total) by mouth once for 1 dose. 195 mL Nedda Gains A, NP   polyethylene glycol powder (MIRALAX) powder Take 255 g by mouth once for 1 dose. 255 g Dahlia ByesBast, Marquinn Meschke A, NP     Controlled Substance Prescriptions [place] Controlled Substance Registry consulted? Not Applicable   Janace ArisBast, Virgel Haro A, NP 03/21/18 1605

## 2018-03-21 NOTE — Discharge Instructions (Addendum)
It was nice meeting you!!  I Believe that your abdominal pain is caused by constipation.  I am sending the mag citrate to your pharmacy Please consume the whole bottle.  You can continue daily MiraLAX after that to maintain bowel movements. If your bowel movements become too frequent or more loose you can decrease to few times a week. He can also try an enema over-the-counter if you get no relief from the mag citrate. If you do not have a bowel movement or develop more severe abdominal pain, nausea, vomiting please go to the ER.

## 2018-03-21 NOTE — ED Triage Notes (Signed)
Pt presents with abdominal pain in lower part of abdominal area

## 2018-04-05 ENCOUNTER — Ambulatory Visit: Payer: Medicaid Other | Admitting: Family

## 2018-04-24 ENCOUNTER — Ambulatory Visit: Payer: Medicaid Other | Admitting: Pediatrics

## 2018-09-04 ENCOUNTER — Ambulatory Visit (HOSPITAL_COMMUNITY)
Admission: EM | Admit: 2018-09-04 | Discharge: 2018-09-04 | Disposition: A | Discharge status: Home / Self Care | Payer: Self-pay | Attending: Family Medicine | Admitting: Family Medicine

## 2018-09-04 ENCOUNTER — Encounter (HOSPITAL_COMMUNITY): Payer: Self-pay | Admitting: Emergency Medicine

## 2018-09-04 DIAGNOSIS — Z113 Encounter for screening for infections with a predominantly sexual mode of transmission: Secondary | ICD-10-CM | POA: Insufficient documentation

## 2018-09-04 DIAGNOSIS — R21 Rash and other nonspecific skin eruption: Secondary | ICD-10-CM

## 2018-09-04 MED ORDER — TRIAMCINOLONE ACETONIDE 0.1 % EX CREA: 1.0000 "application " | TOPICAL_CREAM | Freq: Two times a day (BID) | CUTANEOUS | 0 refills | Status: DC

## 2018-09-04 MED ORDER — PREDNISONE 50 MG PO TABS: 50.0000 mg | ORAL_TABLET | Freq: Every day | ORAL | 0 refills | Status: DC

## 2018-09-04 NOTE — Discharge Instructions (Signed)
Start prednisone and triamcinolone to help with symptoms. As discussed, the scaring may not resolve given length of symptoms. Follow up with dermatology for further evaluation if symptoms not improving.  Cytology sent, you will be contacted with any positive results that requires further treatment. Refrain from sexual activity for the next 7 days.

## 2018-09-04 NOTE — ED Provider Notes (Signed)
[facility]    CSN: [id number] Arrival date & time: 09/04/18  6720     History   Chief Complaint Chief Complaint  Patient presents with  . Rash  . SEXUALLY TRANSMITTED DISEASE    HPI Rebekah Sanders is a 18 y.o. female.   18 year old female comes in for multiple complaints.  States she has had rash and itching to the tattoo she got since 04/2018. States she now has rash/bumps to the tattoo. Denies spreading rash. Denies spreading erythema, warmth, fever. Denies pain. She has been scratching area. Has not tried anything for the symptoms.  She is requesting STD check.  States "figured I get it done since I was here".  She is asymptomatic.  Denies fever, chills, night sweats.  Denies abdominal pain, nausea, vomiting.  Denies urinary symptoms such as frequency, dysuria, hematuria.  Denies vaginal discharge, itching, pain. LMP 09/01/2018     Past Medical History:  Diagnosis Date  . ADHD (attention deficit hyperactivity disorder)   . Asthma   . Auditory hallucinations   . Bipolar 1 disorder (HCC)   . Oppositional defiant disorder     Patient Active Problem List   Diagnosis Date Noted  . Low ferritin 03/03/2018  . Acne vulgaris 03/03/2018  . History of trichomoniasis 03/03/2018  . History of gonorrhea 03/03/2018  . History of chlamydia 03/03/2018  . OSA (obstructive sleep apnea) 05/26/2016  . Screening for STD (sexually transmitted disease) 03/13/2013  . Severe bipolar I disorder, current or most recent episode mixed (HCC) 08/08/2012    Class: Chronic  . ODD (oppositional defiant disorder) 08/08/2012    Class: Chronic  . ADHD (attention deficit hyperactivity disorder), combined type 08/08/2012    Past Surgical History:  Procedure Laterality Date  . TONSILLECTOMY AND ADENOIDECTOMY Bilateral 03/2016    OB History   No obstetric history on file.      Home Medications    Prior to Admission medications   Medication Sig Start Date End Date Taking?  Authorizing Provider  adapalene (DIFFERIN) 0.1 % cream Apply topically at bedtime. 03/02/18   Verneda Skill, FNP  clindamycin-benzoyl peroxide (BENZACLIN) gel Apply topically 2 (two) times daily. Patient not taking: Reported on 09/04/2018 03/02/18   Alfonso Ramus T, FNP  etonogestrel (NEXPLANON) 68 MG IMPL implant 1 each by Subdermal route once.    [provider]  ferrous sulfate (FERROUSUL) 325 (65 FE) MG tablet Take 1 tablet (325 mg total) by mouth 2 (two) times daily with a meal. Patient not taking: Reported on 09/04/2018 03/02/18   Alfonso Ramus T, FNP  norethindrone-ethinyl estradiol-iron (JUNEL FE 1.5/30) 1.5-30 MG-MCG tablet Take 1 tablet by mouth daily. Patient not taking: Reported on 09/04/2018 03/08/18   Alfonso Ramus T, FNP  predniSONE (DELTASONE) 50 MG tablet Take 1 tablet (50 mg total) by mouth daily. 09/04/18   Cathie Hoops, Chiyo Fay V, PA-C  triamcinolone cream (KENALOG) 0.1 % Apply 1 application topically 2 (two) times daily. 09/04/18   Belinda Fisher, PA-C    Family History Family History  Problem Relation Age of Onset  . Bipolar disorder Other        Maternal grandparents  . Depression Other        Parents  . Drug abuse Other        multiple family members by report    Social History Social History   Tobacco Use  . Smoking status: Never Smoker  . Smokeless tobacco: Never Used  . Tobacco comment: father smokes  in home  Substance Use Topics  . Alcohol use: No    Alcohol/week: 0.0 standard drinks    Comment: Pt denies   . Drug use: No    Comment: Pt denies     Allergies   Penicillins   Review of Systems Review of Systems  Reason unable to perform ROS: See HPI as above.     Physical Exam Triage Vital Signs ED Triage Vitals  Enc Vitals Group     BP 09/04/18 1017 120/81     Pulse Rate 09/04/18 1017 83     Resp 09/04/18 1017 13     Temp 09/04/18 1017 98.1 F (36.7 C)     Temp src --      SpO2 09/04/18 1017 99 %     Weight --      Height --      Head  Circumference --      Peak Flow --      Pain Score 09/04/18 1018 0     Pain Loc --      Pain Edu? --      Excl. in GC? --    No data found.  Updated Vital Signs BP 120/81   Pulse 83   Temp 98.1 F (36.7 C)   Resp 13   LMP 09/01/2018   SpO2 99%   Physical Exam Constitutional:      General: She is not in acute distress.    Appearance: She is well-developed. She is not ill-appearing, toxic-appearing or diaphoretic.  HENT:     Head: Normocephalic and atraumatic.  Eyes:     Conjunctiva/sclera: Conjunctivae normal.     Pupils: Pupils are equal, round, and reactive to light.  Skin:    General: Skin is warm and dry.     Comments: Tattoo spanning left flank to buttock. Hyperpigmented maculopapular rash to the left buttock. No surrounding erythema, warmth. No tenderness to palpation.   Neurological:     Mental Status: She is alert and oriented to person, place, and time.      UC Treatments / Results  Labs (all labs ordered are listed, but only abnormal results are displayed) Labs Reviewed  CERVICOVAGINAL ANCILLARY ONLY    EKG None  Radiology No results found.  Procedures Procedures (including critical care time)  Medications Ordered in UC Medications - No data to display  Initial Impression / Assessment and Plan / UC Course  I have reviewed the triage vital signs and the nursing notes.  Pertinent labs & imaging results that were available during my care of the patient were reviewed by me and considered in my medical decision making (see chart for details).    Will cover with prednisone and triamcinolone cream. However, discussed with patient from repeat irritation since 04/2018, there maybe scarring that does not resolve. Will have patient follow up with dermatology if symptoms not improving.  Cytology sent for STD check. Patient to avoid sexual activity for now. Return precautions given.  Final Clinical Impressions(s) / UC Diagnoses   Final diagnoses:  Rash    Screen for STD (sexually transmitted disease)    ED Prescriptions    Medication Sig Dispense Auth. Provider   predniSONE (DELTASONE) 50 MG tablet Take 1 tablet (50 mg total) by mouth daily. 5 tablet Shenice Dolder V, PA-C   triamcinolone cream (KENALOG) 0.1 % Apply 1 application topically 2 (two) times daily. 30 g Threasa AlphaYu, Taeshawn Helfman V, PA-C        Newman Waren V, PA-C 09/04/18  1107  

## 2018-09-04 NOTE — ED Triage Notes (Signed)
Pt states she has a tattoo done in oct c/o rash and itchiness on it. Pt also requesting std check. No symptoms.

## 2018-09-05 LAB — CERVICOVAGINAL ANCILLARY ONLY
Chlamydia: NEGATIVE
Neisseria Gonorrhea: NEGATIVE
Trichomonas: POSITIVE — AB

## 2018-09-07 ENCOUNTER — Telehealth (HOSPITAL_COMMUNITY): Payer: Self-pay | Admitting: Emergency Medicine

## 2018-09-07 MED ORDER — METRONIDAZOLE 500 MG PO TABS: 2000.0000 mg | ORAL_TABLET | Freq: Once | ORAL | 0 refills | Status: AC

## 2018-09-07 NOTE — Telephone Encounter (Signed)
Trichomonas is positive. Rx  for Flagyl 2 grams, once was sent to the pharmacy of record. Pt needs education to refrain from sexual intercourse for 7 days to give the medicine time to work. Sexual partners need to be notified and tested/treated. Condoms may reduce risk of reinfection. Recheck for further evaluation if symptoms are not improving.   Patient contacted and made aware of all results, all questions answered.   

## 2018-11-21 ENCOUNTER — Ambulatory Visit: Payer: Self-pay | Admitting: Family

## 2018-12-04 ENCOUNTER — Ambulatory Visit: Payer: Self-pay | Admitting: Family

## 2018-12-06 ENCOUNTER — Telehealth: Payer: Self-pay | Admitting: *Deleted

## 2018-12-06 ENCOUNTER — Ambulatory Visit: Payer: Self-pay | Admitting: Family

## 2018-12-06 ENCOUNTER — Telehealth: Payer: Self-pay | Admitting: Licensed Clinical Social Worker

## 2018-12-06 NOTE — Telephone Encounter (Signed)
Called parent at [phone] regarding pre-screening for 5/14 visit, but no answer and no option to leave a message.

## 2018-12-06 NOTE — Telephone Encounter (Signed)
Called and spoke with patient. She states that partner tested positive "for both" but did not specify which infections other than trich. Patient states she would like to come in for nurse visit for urine collection and swab. Once specimen can be processed we can send meds to pharmacy.

## 2018-12-06 NOTE — Telephone Encounter (Signed)
Caller states she has some questions.

## 2018-12-07 ENCOUNTER — Ambulatory Visit: Payer: Self-pay | Admitting: Family

## 2019-02-03 ENCOUNTER — Other Ambulatory Visit: Payer: Self-pay

## 2019-02-03 ENCOUNTER — Encounter (HOSPITAL_COMMUNITY): Payer: Self-pay | Admitting: *Deleted

## 2019-02-03 ENCOUNTER — Ambulatory Visit (HOSPITAL_COMMUNITY)
Admission: EM | Admit: 2019-02-03 | Discharge: 2019-02-03 | Disposition: A | Discharge status: Home / Self Care | Payer: BC Managed Care – PPO | Attending: Physician Assistant | Admitting: Physician Assistant

## 2019-02-03 DIAGNOSIS — Z113 Encounter for screening for infections with a predominantly sexual mode of transmission: Secondary | ICD-10-CM | POA: Diagnosis not present

## 2019-02-03 DIAGNOSIS — Z3202 Encounter for pregnancy test, result negative: Secondary | ICD-10-CM | POA: Diagnosis not present

## 2019-02-03 LAB — POCT PREGNANCY, URINE: Preg Test, Ur: NEGATIVE

## 2019-02-03 NOTE — Discharge Instructions (Signed)
We are testing you for Gonorrhea, Chlamydia, Trichomonas, Yeast and Bacterial Vaginosis. We will call you if anything is positive and let you know if you require any further treatment. Please inform partners of any positive results.   Please return if symptoms not improving with treatment, development of fever, nausea, vomiting, abdominal pain.  

## 2019-02-03 NOTE — ED Triage Notes (Signed)
Denies any sxs.  States wishes to be tested for STDs because she "just got out of a relationship".

## 2019-02-04 NOTE — ED Provider Notes (Signed)
[place]    CSN: [id number] Arrival date & time: 02/03/19  1656      History   Chief Complaint Chief Complaint  Patient presents with  . Appointment    1650  . Exposure to STD    HPI Rebekah Sanders is a 18 y.o. female history of asthma, presenting today for STD screening.  Patient states that she would like to be screened as she just got out of a relationship.  Patient she also notes that for the past 6 months she has had discomfort with intercourse.  Denies pain at rest.  Denies any other symptoms of vaginal discharge, itching, irritation.  Denies urinary symptoms of dysuria, increased frequency or urgency.  Denies incomplete voiding or pressure over bladder.  Denies any fevers chills body aches.  Denies nausea or vomiting.  Denies abnormal bleeding.  Last menstrual cycle was over a month ago.  She is not currently on birth control.  She had Nexplanon taken out approximately 1 year ago and has not had regular cycles since.  HPI  Past Medical History:  Diagnosis Date  . ADHD (attention deficit hyperactivity disorder)   . Asthma   . Auditory hallucinations   . Bipolar 1 disorder ([place])   . Oppositional defiant disorder     Patient Active Problem List   Diagnosis Date Noted  . Low ferritin 03/03/2018  . Acne vulgaris 03/03/2018  . History of trichomoniasis 03/03/2018  . History of gonorrhea 03/03/2018  . History of chlamydia 03/03/2018  . OSA (obstructive sleep apnea) 05/26/2016  . Screening for STD (sexually transmitted disease) 03/13/2013  . Severe bipolar I disorder, current or most recent episode mixed ([place]) 08/08/2012    Class: Chronic  . ODD (oppositional defiant disorder) 08/08/2012    Class: Chronic  . ADHD (attention deficit hyperactivity disorder), combined type 08/08/2012    Past Surgical History:  Procedure Laterality Date  . TONSILLECTOMY AND ADENOIDECTOMY Bilateral 03/2016    OB History   No obstetric history on file.      Home  Medications    Prior to Admission medications   Medication Sig Start Date End Date Taking? Authorizing Provider  adapalene (DIFFERIN) 0.1 % cream Apply topically at bedtime. 03/02/18   Trude Mcburney, FNP  clindamycin-benzoyl peroxide (BENZACLIN) gel Apply topically 2 (two) times daily. Patient not taking: Reported on 09/04/2018 03/02/18   Jonathon Resides T, FNP  etonogestrel (NEXPLANON) 68 MG IMPL implant 1 each by Subdermal route once.    [provider]  ferrous sulfate (FERROUSUL) 325 (65 FE) MG tablet Take 1 tablet (325 mg total) by mouth 2 (two) times daily with a meal. Patient not taking: Reported on 09/04/2018 03/02/18   Jonathon Resides T, FNP  norethindrone-ethinyl estradiol-iron (JUNEL FE 1.5/30) 1.5-30 MG-MCG tablet Take 1 tablet by mouth daily. Patient not taking: Reported on 09/04/2018 03/08/18   Jonathon Resides T, FNP  predniSONE (DELTASONE) 50 MG tablet Take 1 tablet (50 mg total) by mouth daily. 09/04/18   Tasia Catchings, Amy V, PA-C  triamcinolone cream (KENALOG) 0.1 % Apply 1 application topically 2 (two) times daily. 09/04/18   Ok Edwards, PA-C    Family History Family History  Problem Relation Age of Onset  . Bipolar disorder Other        Maternal grandparents  . Depression Other        Parents  . Drug abuse Other        multiple family members by report    Social History  Social History   Tobacco Use  . Smoking status: Current Every Day Smoker  . Smokeless tobacco: Never Used  . Tobacco comment: father smokes in home  Substance Use Topics  . Alcohol use: Yes    Comment: approx "a fifth of liquor" weekly on average  . Drug use: Not on file    Comment: Pt denies     Allergies   Penicillins   Review of Systems Review of Systems  Constitutional: Negative for fever.  Respiratory: Negative for shortness of breath.   Cardiovascular: Negative for chest pain.  Gastrointestinal: Negative for abdominal pain, diarrhea, nausea and vomiting.  Genitourinary: Negative  for dysuria, flank pain, genital sores, hematuria, menstrual problem, vaginal bleeding, vaginal discharge and vaginal pain.  Musculoskeletal: Negative for back pain.  Skin: Negative for rash.  Neurological: Negative for dizziness, light-headedness and headaches.     Physical Exam Triage Vital Signs ED Triage Vitals  Enc Vitals Group     BP 02/03/19 1726 116/61     Pulse Rate 02/03/19 1726 87     Resp 02/03/19 1726 16     Temp 02/03/19 1726 98.9 F (37.2 C)     Temp Source 02/03/19 1726 Oral     SpO2 02/03/19 1726 100 %     Weight --      Height --      Head Circumference --      Peak Flow --      Pain Score 02/03/19 1727 0     Pain Loc --      Pain Edu? --      Excl. in GC? --    No data found.  Updated Vital Signs BP 116/61   Pulse 87   Temp 98.9 F (37.2 C) (Oral)   Resp 16   LMP  (LMP Unknown)   SpO2 100%   Visual Acuity Right Eye Distance:   Left Eye Distance:   Bilateral Distance:    Right Eye Near:   Left Eye Near:    Bilateral Near:     Physical Exam Vitals signs and nursing note reviewed.  Constitutional:      General: She is not in acute distress.    Appearance: She is well-developed.  HENT:     Head: Normocephalic and atraumatic.  Eyes:     Conjunctiva/sclera: Conjunctivae normal.  Neck:     Musculoskeletal: Neck supple.  Cardiovascular:     Rate and Rhythm: Normal rate and regular rhythm.     Heart sounds: No murmur.  Pulmonary:     Effort: Pulmonary effort is normal. No respiratory distress.     Breath sounds: Normal breath sounds.  Abdominal:     Palpations: Abdomen is soft.     Tenderness: There is no abdominal tenderness.  Genitourinary:    Comments: Normal external female genitalia, vaginal mucosa pink, cervix without erythema, 2 small white circular areas noted to superior aspect of cervix, not removable, no significant discharge noted on; no cervical motion tenderness, no adnexal tenderness. Skin:    General: Skin is warm and  dry.  Neurological:     Mental Status: She is alert.      UC Treatments / Results  Labs (all labs ordered are listed, but only abnormal results are displayed) Labs Reviewed  POC URINE PREG, ED  POCT PREGNANCY, URINE  CERVICOVAGINAL ANCILLARY ONLY    EKG   Radiology No results found.  Procedures Procedures (including critical care time)  Medications Ordered in UC Medications - No  data to display  Initial Impression / Assessment and Plan / UC Course  I have reviewed the triage vital signs and the nursing notes.  Pertinent labs & imaging results that were available during my care of the patient were reviewed by me and considered in my medical decision making (see chart for details).     Pelvic exam relatively unremarkable.  Advised to follow-up with OB/GYN for Pap smears/further evaluation of dyspareunia..  Cervical swab obtained to send off to check for STDs.  Will call with results.  Currently asymptomatic, deferring any treatment. Final Clinical Impressions(s) / UC Diagnoses   Final diagnoses:  Screen for STD (sexually transmitted disease)     Discharge Instructions     We are testing you for Gonorrhea, Chlamydia, Trichomonas, Yeast and Bacterial Vaginosis. We will call you if anything is positive and let you know if you require any further treatment. Please inform partners of any positive results.   Please return if symptoms not improving with treatment, development of fever, nausea, vomiting, abdominal pain.     ED Prescriptions    None     Controlled Substance Prescriptions [place] Controlled Substance Registry consulted? Not Applicable   Lew DawesWieters, Tiernan Suto C, [place] JerseyPA-C 02/04/19 1000

## 2019-02-05 LAB — CERVICOVAGINAL ANCILLARY ONLY
Chlamydia: NEGATIVE
Neisseria Gonorrhea: NEGATIVE
Trichomonas: POSITIVE — AB

## 2019-02-07 ENCOUNTER — Telehealth (HOSPITAL_COMMUNITY): Payer: Self-pay | Admitting: Family Medicine

## 2019-02-07 ENCOUNTER — Telehealth (HOSPITAL_COMMUNITY): Payer: Self-pay | Admitting: Emergency Medicine

## 2019-02-07 MED ORDER — METRONIDAZOLE 500 MG PO TABS: 2000.0000 mg | ORAL_TABLET | Freq: Once | ORAL | 0 refills | Status: AC

## 2019-02-07 MED ORDER — METRONIDAZOLE 500 MG PO TABS: 2000.0000 mg | ORAL_TABLET | Freq: Once | ORAL | 0 refills | Status: DC

## 2019-02-07 NOTE — Telephone Encounter (Signed)
Wants flagyl to go to [facility]

## 2019-02-07 NOTE — Telephone Encounter (Signed)
Trichomonas is positive. Rx  for Flagyl 2 grams, once was sent to the pharmacy of record. Pt needs education to refrain from sexual intercourse for 7 days to give the medicine time to work. Sexual partners need to be notified and tested/treated. Condoms may reduce risk of reinfection. Recheck for further evaluation if symptoms are not improving.   Attempted to reach patient. No answer at this time. Voicemail left.     

## 2019-02-09 ENCOUNTER — Telehealth (HOSPITAL_COMMUNITY): Payer: Self-pay | Admitting: Emergency Medicine

## 2019-02-09 NOTE — Telephone Encounter (Signed)
Attempted to reach patient x2. No answer at this time. Voicemail left.    

## 2019-02-12 ENCOUNTER — Telehealth (HOSPITAL_COMMUNITY): Payer: Self-pay | Admitting: Emergency Medicine

## 2019-02-12 NOTE — Telephone Encounter (Signed)
Attempted to reach patient x3. No answer at this time. Voicemail left. Letter sent    

## 2019-02-13 ENCOUNTER — Ambulatory Visit (INDEPENDENT_AMBULATORY_CARE_PROVIDER_SITE_OTHER): Payer: BC Managed Care – PPO | Admitting: Medical

## 2019-02-13 ENCOUNTER — Other Ambulatory Visit: Payer: Self-pay

## 2019-02-13 ENCOUNTER — Encounter: Payer: Self-pay | Admitting: Medical

## 2019-02-13 VITALS — BP 119/76 | HR 64 | Temp 98.6°F | Ht 67.0 in | Wt 195.0 lb

## 2019-02-13 DIAGNOSIS — Z124 Encounter for screening for malignant neoplasm of cervix: Secondary | ICD-10-CM | POA: Diagnosis not present

## 2019-02-13 DIAGNOSIS — N889 Noninflammatory disorder of cervix uteri, unspecified: Secondary | ICD-10-CM | POA: Diagnosis not present

## 2019-02-13 DIAGNOSIS — Z0000 Encounter for general adult medical examination without abnormal findings: Secondary | ICD-10-CM

## 2019-02-13 NOTE — Patient Instructions (Signed)

## 2019-02-13 NOTE — Progress Notes (Signed)
  History:  [name] is a 18 y.o. female who presents to clinic today for follow-up from Urgent Care. Patient had STD testing at Urgent care and complained of pain with intercourse. While at Urgent care she was told that she had white plaques on her cervix. She tested positive for trichomonas at Urgent Care and has Rx but has not yet been treated. She has been advised that partner treatment and abstinence x 2 weeks is needed.  She states that pain with intercourse started ~ 6 months ago. She happened to have trichomonas at that time as well. She was treated and the pain resolved until recently.   The following portions of the patient's history were reviewed and updated as appropriate: allergies, current medications, family history, past medical history, social history, past surgical history and problem list.  Review of Systems:  Review of Systems  Constitutional: Negative for fever.  Gastrointestinal: Negative for abdominal pain.  Genitourinary:       Neg - vaginal bleeding + vaginal discharge      Objective:  Physical Exam BP 119/76   Pulse 64   Temp 98.6 F (37 C)   Ht 5\' 7"  (1.702 m)   Wt 195 lb (88.5 kg)   LMP 01/30/2019 (Approximate)   BMI 30.54 kg/m  Physical Exam  Nursing note and vitals reviewed. Constitutional: She is oriented to person, place, and time. She appears well-developed and well-nourished. No distress.  HENT:  Head: Normocephalic and atraumatic.  Cardiovascular: Normal rate.  Respiratory: Effort normal.  GI: Soft. She exhibits no distension and no mass. There is no abdominal tenderness. There is no rebound and no guarding.  Genitourinary: Cervix exhibits no motion tenderness, no discharge and no friability.    Vaginal discharge (moderate white) present.     No vaginal bleeding.  No bleeding in the vagina.  Neurological: She is alert and oriented to person, place, and time.  Skin: Skin is warm and dry. No erythema.  Psychiatric: She has a normal mood  and affect.    Assessment & Plan:  1. Well woman exam (no gynecological exam) - Results from Urgent Care reviewed - Patient will take Rx for Flagyl for trichomonas today. All other testing was negative.   2. Cervix abnormality - Pap smear obtained today due to presence of subtle white plaques on the cervix - Cytology - PAP( [place])  3. Dyspareunia - Mild and intermittent  - Advised to treat trichomonas today and abstain from intercourse x 2 weeks and to make sure partner is treated  - If pain continues after adequate treatment then we can do more evaluation at that time  Danielle Rankin 02/13/2019 4:48 PM

## 2019-02-14 LAB — CYTOLOGY - PAP: Diagnosis: NEGATIVE

## 2019-06-04 ENCOUNTER — Encounter (HOSPITAL_COMMUNITY): Payer: Self-pay

## 2019-06-04 ENCOUNTER — Inpatient Hospital Stay (EMERGENCY_DEPARTMENT_HOSPITAL)
Admission: AD | Admit: 2019-06-04 | Discharge: 2019-06-04 | Disposition: A | Discharge status: Home / Self Care | Payer: BC Managed Care – PPO | Source: Home / Self Care | Attending: Obstetrics & Gynecology | Admitting: Obstetrics & Gynecology

## 2019-06-04 ENCOUNTER — Other Ambulatory Visit: Payer: Self-pay

## 2019-06-04 DIAGNOSIS — O26891 Other specified pregnancy related conditions, first trimester: Secondary | ICD-10-CM | POA: Insufficient documentation

## 2019-06-04 DIAGNOSIS — J45909 Unspecified asthma, uncomplicated: Secondary | ICD-10-CM | POA: Diagnosis not present

## 2019-06-04 DIAGNOSIS — R102 Pelvic and perineal pain: Secondary | ICD-10-CM | POA: Insufficient documentation

## 2019-06-04 DIAGNOSIS — N760 Acute vaginitis: Secondary | ICD-10-CM | POA: Diagnosis not present

## 2019-06-04 DIAGNOSIS — F17210 Nicotine dependence, cigarettes, uncomplicated: Secondary | ICD-10-CM | POA: Diagnosis not present

## 2019-06-04 DIAGNOSIS — O208 Other hemorrhage in early pregnancy: Secondary | ICD-10-CM | POA: Diagnosis not present

## 2019-06-04 DIAGNOSIS — Z3A01 Less than 8 weeks gestation of pregnancy: Secondary | ICD-10-CM | POA: Insufficient documentation

## 2019-06-04 DIAGNOSIS — N926 Irregular menstruation, unspecified: Secondary | ICD-10-CM | POA: Insufficient documentation

## 2019-06-04 DIAGNOSIS — O23591 Infection of other part of genital tract in pregnancy, first trimester: Secondary | ICD-10-CM | POA: Diagnosis not present

## 2019-06-04 LAB — URINALYSIS, ROUTINE W REFLEX MICROSCOPIC
Bilirubin Urine: NEGATIVE
Glucose, UA: NEGATIVE mg/dL
Hgb urine dipstick: NEGATIVE
Ketones, ur: 5 mg/dL — AB
Leukocytes,Ua: NEGATIVE
Nitrite: NEGATIVE
Protein, ur: NEGATIVE mg/dL
Specific Gravity, Urine: 1.008 (ref 1.005–1.030)
pH: 6 (ref 5.0–8.0)

## 2019-06-04 LAB — POC URINE PREG, ED: Preg Test, Ur: POSITIVE — AB

## 2019-06-04 MED ORDER — SODIUM CHLORIDE 0.9% FLUSH
3.0000 mL | Freq: Once | INTRAVENOUS | Status: AC
  Administered 2019-06-05: 02:00:00 3 mL via INTRAVENOUS

## 2019-06-04 NOTE — Discharge Instructions (Signed)
[facility] Prenatal Care Providers °         °[facility]  ° Phone: [phone] ° °[facility] @ [facility]  ° Phone: [phone] ° °[facility]      ° Phone: [phone]   °         °[facility] @ [facility]    ° Phone: [phone] °         °[facility] @ [place] [facility]  ° Phone: [phone] ° °[facility] @ [facility] ° Phone: [phone] °    °[facility] ° Phone: [phone] °

## 2019-06-04 NOTE — MAU Note (Signed)
Been real sleepy, thought she might be preg.  Took a test, it was positive.  Wanting to know how many wks she is. Denies any pain or bleeding.

## 2019-06-04 NOTE — MAU Provider Note (Signed)
  S:   18 y.o. No obstetric history on file. @Unknown  by LMP presents to MAU for pregnancy confirmation.  She denies abdominal pain or vaginal bleeding today.    O: BP 113/76 (BP Location: Right Arm)   Pulse 88   Temp 98.6 F (37 C) (Oral)   Resp 17   Wt 89 kg   LMP 04/19/2019 Comment: bled for an hour on 10/24  SpO2 99%   BMI 30.71 kg/m  Physical Examination: General appearance - alert, well appearing, and in no distress, oriented to person, place, and time and acyanotic, in no respiratory distress  No results found for this or any previous visit (from the past 48 hour(s)).  A: Missed menses  P: D/C home Informed pt we do not do pregnancy verifications in MAU Referred to [facility] for pregnancy test & verification Return to MAU as needed for pregnancy related emergencies  Noni Saupe, NP 6:27 PM

## 2019-06-04 NOTE — ED Triage Notes (Signed)
Pt arrived with complaints of bilateral abdominal cramping that started yesterday, states she took 3 at home pregnancy tests that were positive. Reports last menstrual period on Oct 7. Denies any vaginal bleeding or discharge.

## 2019-06-05 ENCOUNTER — Emergency Department (HOSPITAL_COMMUNITY)
Admission: EM | Admit: 2019-06-05 | Discharge: 2019-06-05 | Disposition: A | Discharge status: Home / Self Care | Payer: BC Managed Care – PPO | Attending: Emergency Medicine | Admitting: Emergency Medicine

## 2019-06-05 ENCOUNTER — Emergency Department (HOSPITAL_COMMUNITY): Payer: BC Managed Care – PPO

## 2019-06-05 DIAGNOSIS — O26891 Other specified pregnancy related conditions, first trimester: Secondary | ICD-10-CM | POA: Diagnosis not present

## 2019-06-05 DIAGNOSIS — R102 Pelvic and perineal pain: Secondary | ICD-10-CM

## 2019-06-05 DIAGNOSIS — N760 Acute vaginitis: Secondary | ICD-10-CM

## 2019-06-05 DIAGNOSIS — O26899 Other specified pregnancy related conditions, unspecified trimester: Secondary | ICD-10-CM

## 2019-06-05 DIAGNOSIS — Z3A01 Less than 8 weeks gestation of pregnancy: Secondary | ICD-10-CM | POA: Diagnosis not present

## 2019-06-05 DIAGNOSIS — B9689 Other specified bacterial agents as the cause of diseases classified elsewhere: Secondary | ICD-10-CM

## 2019-06-05 LAB — CBC
HCT: 37.5 % (ref 36.0–46.0)
Hemoglobin: 12.5 g/dL (ref 12.0–15.0)
MCH: 32.7 pg (ref 26.0–34.0)
MCHC: 33.3 g/dL (ref 30.0–36.0)
MCV: 98.2 fL (ref 80.0–100.0)
Platelets: 302 10*3/uL (ref 150–400)
RBC: 3.82 MIL/uL — ABNORMAL LOW (ref 3.87–5.11)
RDW: 14 % (ref 11.5–15.5)
WBC: 11 10*3/uL — ABNORMAL HIGH (ref 4.0–10.5)
nRBC: 0 % (ref 0.0–0.2)

## 2019-06-05 LAB — WET PREP, GENITAL
Sperm: NONE SEEN
Trich, Wet Prep: NONE SEEN
Yeast Wet Prep HPF POC: NONE SEEN

## 2019-06-05 LAB — COMPREHENSIVE METABOLIC PANEL
ALT: 12 U/L (ref 0–44)
AST: 18 U/L (ref 15–41)
Albumin: 3.7 g/dL (ref 3.5–5.0)
Alkaline Phosphatase: 43 U/L (ref 38–126)
Anion gap: 9 (ref 5–15)
BUN: 8 mg/dL (ref 6–20)
CO2: 20 mmol/L — ABNORMAL LOW (ref 22–32)
Calcium: 8.7 mg/dL — ABNORMAL LOW (ref 8.9–10.3)
Chloride: 105 mmol/L (ref 98–111)
Creatinine, Ser: 0.73 mg/dL (ref 0.44–1.00)
GFR calc Af Amer: >60 mL/min (ref >60)
GFR calc non Af Amer: >60 mL/min (ref >60)
Glucose, Bld: 98 mg/dL (ref 70–99)
Potassium: 3.4 mmol/L — ABNORMAL LOW (ref 3.5–5.1)
Sodium: 134 mmol/L — ABNORMAL LOW (ref 135–145)
Total Bilirubin: 0.7 mg/dL (ref 0.3–1.2)
Total Protein: 7.5 g/dL (ref 6.5–8.1)

## 2019-06-05 LAB — LIPASE, BLOOD: Lipase: 21 U/L (ref 11–51)

## 2019-06-05 LAB — HCG, QUANTITATIVE, PREGNANCY: hCG, Beta Chain, Quant, S: 4665 m[IU]/mL — ABNORMAL HIGH (ref <5)

## 2019-06-05 LAB — I-STAT BETA HCG BLOOD, ED (MC, WL, AP ONLY): I-stat hCG, quantitative: >2000 m[IU]/mL — ABNORMAL HIGH (ref <5)

## 2019-06-05 LAB — HIV ANTIBODY (ROUTINE TESTING W REFLEX): HIV Screen 4th Generation wRfx: NONREACTIVE

## 2019-06-05 LAB — RPR: RPR Ser Ql: NONREACTIVE

## 2019-06-05 MED ORDER — METRONIDAZOLE 500 MG PO TABS: 500.0000 mg | ORAL_TABLET | Freq: Two times a day (BID) | ORAL | 0 refills | Status: DC

## 2019-06-05 MED ORDER — PRENATAL COMPLETE 14-0.4 MG PO TABS: 1.0000 | ORAL_TABLET | Freq: Every day | ORAL | 0 refills | Status: DC

## 2019-06-05 NOTE — ED Notes (Signed)
Pt states she took 3 at home pregnancy tests that were all positive, presents to the ED today with intermittent lower abdominal cramping and abnormal vaginal bleeding that lasted "only an hour" but pt described as very heavy and not during her usual cycle. Pt also request, STD and HIV/AIDs testing.

## 2019-06-05 NOTE — Discharge Instructions (Addendum)
Take the prenatal vitamin daily and call to schedule your prenatal appointment with an OB of your choice.   Take Flagyl for the next 7 days for vaginal infection. If you develop severe pain, vaginal bleeding or new concern, return to the emergency department for further evaluation.

## 2019-06-05 NOTE — ED Provider Notes (Signed)
[facility]-EMERGENCY DEPT Provider Note   CSN: [id number] Arrival date & time: 06/04/19  1904     History   Chief Complaint Chief Complaint  Patient presents with  . Abdominal Cramping  . Abdominal Pain    HPI Rebekah Sanders is a 18 y.o. female.     Patient to ED with complaint of pelivc/lower abdominal pain that she describes as sharp cramping pain. LMP 05/02/19. She reports positive pregnancy tests at home. G1P0. She had vaginal bleeding 2 days ago that lasted only 1-2 hours but has been cramping since, reporting left > right pelvic pain. No urinary symptoms, vaginal discharge. No fever, nausea, vomiting.  The history is provided by the patient. No language interpreter was used.  Abdominal Cramping Associated symptoms include abdominal pain.  Abdominal Pain Associated symptoms: vaginal bleeding (See HPI.)   Associated symptoms: no chills, no dysuria, no fever, no nausea and no vaginal discharge     Past Medical History:  Diagnosis Date  . ADHD (attention deficit hyperactivity disorder)   . Asthma   . Auditory hallucinations   . Bipolar 1 disorder ([facility])   . Oppositional defiant disorder     Patient Active Problem List   Diagnosis Date Noted  . Low ferritin 03/03/2018  . Acne vulgaris 03/03/2018  . History of trichomoniasis 03/03/2018  . History of gonorrhea 03/03/2018  . History of chlamydia 03/03/2018  . OSA (obstructive sleep apnea) 05/26/2016  . Screening for STD (sexually transmitted disease) 03/13/2013  . Severe bipolar I disorder, current or most recent episode mixed ([facility]) 08/08/2012    Class: Chronic  . ODD (oppositional defiant disorder) 08/08/2012    Class: Chronic  . ADHD (attention deficit hyperactivity disorder), combined type 08/08/2012    Past Surgical History:  Procedure Laterality Date  . TONSILLECTOMY AND ADENOIDECTOMY Bilateral 03/2016     OB History   No obstetric history on file.      Home Medications    Prior to Admission medications   Medication Sig Start Date End Date Taking? Authorizing Provider  ferrous sulfate (FERROUSUL) 325 (65 FE) MG tablet Take 1 tablet (325 mg total) by mouth 2 (two) times daily with a meal. Patient not taking: Reported on 09/04/2018 03/02/18   Verneda Skill, FNP    Family History Family History  Problem Relation Age of Onset  . Bipolar disorder Other        Maternal grandparents  . Depression Other        Parents  . Drug abuse Other        multiple family members by report    Social History Social History   Tobacco Use  . Smoking status: Current Every Day Smoker  . Smokeless tobacco: Never Used  . Tobacco comment: father smokes in home  Substance Use Topics  . Alcohol use: Yes    Comment: approx "a fifth of liquor" weekly on average  . Drug use: Not on file    Comment: Pt denies     Allergies   Penicillins   Review of Systems Review of Systems  Constitutional: Negative for chills and fever.  Gastrointestinal: Positive for abdominal pain. Negative for nausea.  Genitourinary: Positive for pelvic pain and vaginal bleeding (See HPI.). Negative for dysuria and vaginal discharge.  Musculoskeletal: Negative.  Negative for back pain.  Skin: Negative.   Neurological: Negative.      Physical Exam Updated Vital Signs BP 114/70   Pulse 73   Temp 98.7 F (37.1 C) (Oral)  Resp 14   Ht 5\' 7"  (1.702 m)   Wt 88.9 kg   LMP 05/02/2019   SpO2 100%   BMI 30.70 kg/m   Physical Exam Constitutional:      Appearance: She is well-developed.  HENT:     Head: Normocephalic.  Neck:     Musculoskeletal: Normal range of motion and neck supple.  Cardiovascular:     Rate and Rhythm: Normal rate.  Pulmonary:     Effort: Pulmonary effort is normal.  Abdominal:     General: Bowel sounds are normal.     Palpations: Abdomen is soft.     Tenderness: There is generalized abdominal tenderness. There is no guarding or rebound.  Genitourinary:     Vagina: Normal.     Cervix: Discharge present. No cervical motion tenderness or friability.     Uterus: Normal. Tender.      Adnexa:        Right: No tenderness.         Left: Tenderness present. No mass.    Musculoskeletal: Normal range of motion.  Skin:    General: Skin is warm and dry.     Findings: No rash.  Neurological:     Mental Status: She is alert and oriented to person, place, and time.      ED Treatments / Results  Labs (all labs ordered are listed, but only abnormal results are displayed) Labs Reviewed  COMPREHENSIVE METABOLIC PANEL - Abnormal; Notable for the following components:      Result Value   Sodium 134 (*)    Potassium 3.4 (*)    CO2 20 (*)    Calcium 8.7 (*)    All other components within normal limits  CBC - Abnormal; Notable for the following components:   WBC 11.0 (*)    RBC 3.82 (*)    All other components within normal limits  URINALYSIS, ROUTINE W REFLEX MICROSCOPIC - Abnormal; Notable for the following components:   Ketones, ur 5 (*)    All other components within normal limits  I-STAT BETA HCG BLOOD, ED ([facility], [facility], [facility] ONLY) - Abnormal; Notable for the following components:   I-stat hCG, quantitative >2,000.0 (*)    All other components within normal limits  POC URINE PREG, ED - Abnormal; Notable for the following components:   Preg Test, Ur POSITIVE (*)    All other components within normal limits  WET PREP, GENITAL  LIPASE, BLOOD  RPR  HIV ANTIBODY (ROUTINE TESTING W REFLEX)  HCG, QUANTITATIVE, PREGNANCY  GC/CHLAMYDIA PROBE AMP () NOT AT [facility]    EKG None  Radiology No results found.  Procedures Procedures (including critical care time)  Medications Ordered in ED Medications  sodium chloride flush (NS) 0.9 % injection 3 mL (3 mLs Intravenous Given 06/05/19 0146)     Initial Impression / Assessment and Plan / ED Course  I have reviewed the triage vital signs and the nursing notes.  Pertinent labs & imaging  results that were available during my care of the patient were reviewed by me and considered in my medical decision making (see chart for details).        Patient with positive home pregnancy test presents with pelvic pain and limited vaginal bleeding 2 days ago. No fever, nausea, vomiting or discharge.   Quantitative Hcg 4400, [place] c/w single IUP at [redacted]w[redacted]d. She does have a BV infection which will be treated with Flagyl orally. She is encouraged to establish with OB for prenatal  care. Will start prenatal vitamins.    Final Clinical Impressions(s) / ED Diagnoses   Final diagnoses:  Pelvic pain during pregnancy  BV  ED Discharge Orders    None       Elpidio AnisUpstill, Avaree Gilberti, PA-C 06/05/19 0559    Dione BoozeGlick, David, MD 06/05/19 [phone]

## 2019-06-05 NOTE — ED Notes (Signed)
Pelvic at bedside   

## 2019-06-06 LAB — GC/CHLAMYDIA PROBE AMP (~~LOC~~) NOT AT ARMC
Chlamydia: POSITIVE — AB
Neisseria Gonorrhea: NEGATIVE

## 2019-06-07 ENCOUNTER — Telehealth: Payer: Self-pay | Admitting: Medical

## 2019-06-07 DIAGNOSIS — A749 Chlamydial infection, unspecified: Secondary | ICD-10-CM

## 2019-06-07 MED ORDER — AZITHROMYCIN 250 MG PO TABS: 1000.0000 mg | ORAL_TABLET | Freq: Once | ORAL | 0 refills | Status: AC

## 2019-06-07 NOTE — Telephone Encounter (Addendum)
Rebekah Sanders tested positive for  Chlamydia. Patient was called by RN and allergies and pharmacy confirmed. Rx sent to pharmacy of choice.   Luvenia Redden, PA-C 06/07/2019 10:09 AM       ----- Message from Bjorn Loser, RN sent at 06/06/2019  4:20 PM EST ----- This patient tested positive for :  Chlamydia  She is allergic to" Penicillin". I have informed the patient of her results and confirmed her pharmacy is correct in her chart. Please send Rx.   Thank you,   Bjorn Loser, RN   Results faxed to [facility].

## 2019-06-12 ENCOUNTER — Other Ambulatory Visit: Payer: Self-pay | Admitting: Obstetrics & Gynecology

## 2019-06-12 DIAGNOSIS — Z3201 Encounter for pregnancy test, result positive: Secondary | ICD-10-CM | POA: Diagnosis not present

## 2019-06-26 DIAGNOSIS — Z3201 Encounter for pregnancy test, result positive: Secondary | ICD-10-CM | POA: Diagnosis not present

## 2019-07-12 ENCOUNTER — Inpatient Hospital Stay (HOSPITAL_COMMUNITY)
Admission: AD | Admit: 2019-07-12 | Discharge: 2019-07-12 | Disposition: A | Discharge status: Home / Self Care | Payer: BC Managed Care – PPO | Attending: Obstetrics and Gynecology | Admitting: Obstetrics and Gynecology

## 2019-07-12 ENCOUNTER — Other Ambulatory Visit: Payer: Self-pay

## 2019-07-12 ENCOUNTER — Encounter (HOSPITAL_COMMUNITY): Payer: Self-pay | Admitting: *Deleted

## 2019-07-12 DIAGNOSIS — Z3A10 10 weeks gestation of pregnancy: Secondary | ICD-10-CM

## 2019-07-12 DIAGNOSIS — O98812 Other maternal infectious and parasitic diseases complicating pregnancy, second trimester: Secondary | ICD-10-CM

## 2019-07-12 DIAGNOSIS — A599 Trichomoniasis, unspecified: Secondary | ICD-10-CM

## 2019-07-12 DIAGNOSIS — F17210 Nicotine dependence, cigarettes, uncomplicated: Secondary | ICD-10-CM | POA: Diagnosis not present

## 2019-07-12 DIAGNOSIS — O98811 Other maternal infectious and parasitic diseases complicating pregnancy, first trimester: Secondary | ICD-10-CM | POA: Diagnosis not present

## 2019-07-12 DIAGNOSIS — Z880 Allergy status to penicillin: Secondary | ICD-10-CM | POA: Diagnosis not present

## 2019-07-12 DIAGNOSIS — O99331 Smoking (tobacco) complicating pregnancy, first trimester: Secondary | ICD-10-CM | POA: Diagnosis not present

## 2019-07-12 DIAGNOSIS — O209 Hemorrhage in early pregnancy, unspecified: Secondary | ICD-10-CM | POA: Diagnosis not present

## 2019-07-12 DIAGNOSIS — Z8619 Personal history of other infectious and parasitic diseases: Secondary | ICD-10-CM

## 2019-07-12 LAB — URINALYSIS, ROUTINE W REFLEX MICROSCOPIC
Bilirubin Urine: NEGATIVE
Glucose, UA: NEGATIVE mg/dL
Ketones, ur: NEGATIVE mg/dL
Nitrite: NEGATIVE
Protein, ur: NEGATIVE mg/dL
Specific Gravity, Urine: 1.02 (ref 1.005–1.030)
pH: 8 (ref 5.0–8.0)

## 2019-07-12 LAB — URINALYSIS, MICROSCOPIC (REFLEX)

## 2019-07-12 LAB — WET PREP, GENITAL
Clue Cells Wet Prep HPF POC: NONE SEEN
Yeast Wet Prep HPF POC: NONE SEEN

## 2019-07-12 MED ORDER — METRONIDAZOLE 500 MG PO TABS
2000.0000 mg | ORAL_TABLET | Freq: Once | ORAL | Status: AC
  Administered 2019-07-12: 2000 mg via ORAL
  Filled 2019-07-12: qty 4

## 2019-07-12 NOTE — Discharge Instructions (Signed)
Trichomoniasis Trichomoniasis is an STI (sexually transmitted infection) that can affect both women and men. In women, the outer area of the female genitalia (vulva) and the vagina are affected. In men, mainly the penis is affected, but the prostate and other reproductive organs can also be involved.  This condition can be treated with medicine. It often has no symptoms (is asymptomatic), especially in men. If not treated, trichomoniasis can last for months or years. What are the causes? This condition is caused by a parasite called Trichomonas vaginalis. Trichomoniasis most often spreads from person to person (is contagious) through sexual contact. What increases the risk? The following factors may make you more likely to develop this condition:  Having unprotected sex.  Having sex with a partner who has trichomoniasis.  Having multiple sexual partners.  Having had previous trichomoniasis infections or other STIs. What are the signs or symptoms? In women, symptoms of trichomoniasis include:  Abnormal vaginal discharge that is clear, white, gray, or yellow-green and foamy and has an unusual "fishy" odor.  Itching and irritation of the vagina and vulva.  Burning or pain during urination or sex.  Redness and swelling of the genitals. In men, symptoms of trichomoniasis include:  Penile discharge that may be foamy or contain pus.  Pain in the penis. This may happen only when urinating.  Itching or irritation inside the penis.  Burning after urination or ejaculation. How is this diagnosed? In women, this condition may be found during a routine Pap test or physical exam. It may be found in men during a routine physical exam. Your health care provider may do tests to help diagnose this infection, such as:  Urine tests (men and women).  The following in women: ? Testing the pH of the vagina. ? A vaginal swab test that checks for the Trichomonas vaginalis parasite. ? Testing vaginal  secretions. Your health care provider may test you for other STIs, including HIV (human immunodeficiency virus). How is this treated? This condition is treated with medicine taken by mouth (orally), such as metronidazole or tinidazole, to fight the infection. Your sexual partner(s) also need to be tested and treated.  If you are a woman and you plan to become pregnant or think you may be pregnant, tell your health care provider right away. Some medicines that are used to treat the infection should not be taken during pregnancy. Your health care provider may recommend over-the-counter medicines or creams to help relieve itching or irritation. You may be tested for infection again 3 months after treatment. Follow these instructions at home:  Take and use over-the-counter and prescription medicines, including creams, only as told by your health care provider.  Take your antibiotic medicine as told by your health care provider. Do not stop taking the antibiotic even if you start to feel better.  Do not have sex until 7-10 days after you finish your medicine, or until your health care provider approves. Ask your health care provider when you may start to have sex again.  (Women) Do not douche or wear tampons while you have the infection.  Discuss your infection with your sexual partner(s). Make sure that your partner gets tested and treated, if necessary.  Keep all follow-up visits as told by your health care provider. This is important. How is this prevented?   Use condoms every time you have sex. Using condoms correctly and consistently can help protect against STIs.  Avoid having multiple sexual partners.  Talk with your sexual partner about any   symptoms that either of you may have, as well as any history of STIs.  Get tested for STIs and STDs (sexually transmitted diseases) before you have sex. Ask your partner to do the same.  Do not have sexual contact if you have symptoms of  trichomoniasis or another STI. Contact a health care provider if:  You still have symptoms after you finish your medicine.  You develop pain in your abdomen.  You have pain when you urinate.  You have bleeding after sex.  You develop a rash.  You feel nauseous or you vomit.  You plan to become pregnant or think you may be pregnant. Summary  Trichomoniasis is an STI (sexually transmitted infection) that can affect both women and men.  This condition often has no symptoms (is asymptomatic), especially in men.  Without treatment, this condition can last for months or years.  You should not have sex until 7-10 days after you finish your medicine, or until your health care provider approves. Ask your health care provider when you may start to have sex again.  Discuss your infection with your sexual partner(s). Make sure that your partner gets tested and treated, if necessary. This information is not intended to replace advice given to you by your health care provider. Make sure you discuss any questions you have with your health care provider. Document Released: 01/05/2001 Document Revised: 04/25/2018 Document Reviewed: 04/25/2018 Elsevier Patient Education  2020 Elsevier Inc.  

## 2019-07-12 NOTE — MAU Note (Signed)
.   Rebekah Sanders is a 18 y.o. at [redacted]w[redacted]d here in MAU reporting: she had vaginal bleeding when she wiped this morning after intercourse. Denies pain LMP: 06/15/19 Onset of complaint:this morning Pain score: 0 Vitals:   07/12/19 0750  BP: 120/79  Pulse: 87  Resp: 18  Temp: 97.8 F (36.6 C)  SpO2: 99%     FHT: Lab orders placed from triage: UA

## 2019-07-12 NOTE — MAU Provider Note (Signed)
Patient Rebekah Sanders is a 18 y.o. G1P0 At [redacted]w[redacted]d here with complaints of occasional vaginal spotting. The most recent episode was this morning after intercourse.  She has had a prenatal apppointment at [facility]; she had an [place] that dated her at 9 weeks 3 days one week ago. She denies abdominal pain, cramping, nausea, vomiting, dysuria, or other ob-gyn complaints.  History     CSN: [id number]  Arrival date and time: 07/12/19 2297   None     Chief Complaint  Patient presents with  . Vaginal Bleeding   Vaginal Bleeding The patient's primary symptoms include vaginal bleeding. This is a new problem. The current episode started yesterday. The problem occurs intermittently. The problem has been resolved. The patient is experiencing no pain. She is pregnant. Pertinent negatives include no abdominal pain, constipation, diarrhea, dysuria, painful intercourse, sore throat or urgency. The vaginal discharge was bloody. The vaginal bleeding is spotting. She has not been passing clots. She has not been passing tissue.   Yesterday she had a "tiny amount" of brown blood on her toilet paper, and then this morning she had some light pink spotting after intercourse.  OB History    Gravida  1   Para      Term      Preterm      AB      Living        SAB      TAB      Ectopic      Multiple      Live Births              Past Medical History:  Diagnosis Date  . ADHD (attention deficit hyperactivity disorder)   . Asthma   . Auditory hallucinations   . Bipolar 1 disorder (HCC)   . Oppositional defiant disorder     Past Surgical History:  Procedure Laterality Date  . TONSILLECTOMY AND ADENOIDECTOMY Bilateral 03/2016    Family History  Problem Relation Age of Onset  . Bipolar disorder Other        Maternal grandparents  . Depression Other        Parents  . Drug abuse Other        multiple family members by report    Social History   Tobacco Use  . Smoking status:  Current Every Day Smoker  . Smokeless tobacco: Never Used  . Tobacco comment: father smokes in home  Substance Use Topics  . Alcohol use: Yes    Comment: approx "a fifth of liquor" weekly on average  . Drug use: Not on file    Comment: Pt denies    Allergies:  Allergies  Allergen Reactions  . Penicillins Hives    Medications Prior to Admission  Medication Sig Dispense Refill Last Dose  . Prenatal Vit-Fe Fumarate-FA (PRENATAL COMPLETE) 14-0.4 MG TABS Take 1 tablet by mouth daily. 60 tablet 0 07/11/2019 at Unknown time  . ferrous sulfate (FERROUSUL) 325 (65 FE) MG tablet Take 1 tablet (325 mg total) by mouth 2 (two) times daily with a meal. (Patient not taking: Reported on 09/04/2018) 60 tablet 3   . metroNIDAZOLE (FLAGYL) 500 MG tablet Take 1 tablet (500 mg total) by mouth 2 (two) times daily. 14 tablet 0     Review of Systems  Constitutional: Negative.   HENT: Negative.  Negative for sore throat.   Respiratory: Negative.   Gastrointestinal: Negative for abdominal pain, constipation and diarrhea.  Genitourinary: Positive for vaginal bleeding. Negative for  dysuria and urgency.  Psychiatric/Behavioral: Negative.    Physical Exam   Blood pressure 120/79, pulse 87, temperature 97.8 F (36.6 C), temperature source Oral, resp. rate 18, weight 86.2 kg, last menstrual period 05/02/2019, SpO2 99 %.  Physical Exam  Constitutional: She appears well-developed and well-nourished.  HENT:  Head: Normocephalic.  Eyes: Pupils are equal, round, and reactive to light.  Respiratory: Effort normal.  GI: Soft.  Genitourinary:    Genitourinary Comments:  NEFG; no blood, discharge in the vagina, no lesions on vaginal walls or cervix; no CMT, suprapubic or adnexal tenderness.    Musculoskeletal:     Cervical back: Normal range of motion.  Neurological: She is alert.  Skin: Skin is warm and dry.    MAU Course  Procedures  MDM Wet prep: positive for trich GC chlamydia pending UA :  pending FHR is 169 by Doppler.   Patient received 2 grams of Flagyl in MAU; tolerated well.  Assessment and Plan   1. Trichomoniasis    2. Patient given RX for flagyl to give to partner; gave precautions about intercourse until partner is treated. Recommend pelvic rest as well until 14 weeks.   3. Keep follow up appt at [facility].   4. Return to MAU if condition worsens or changes  Mervyn Skeeters [facility] 07/12/2019, 8:19 AM

## 2019-07-13 LAB — GC/CHLAMYDIA PROBE AMP (~~LOC~~) NOT AT ARMC
Chlamydia: NEGATIVE
Comment: NEGATIVE
Comment: NORMAL
Neisseria Gonorrhea: NEGATIVE

## 2019-07-16 DIAGNOSIS — N911 Secondary amenorrhea: Secondary | ICD-10-CM | POA: Diagnosis not present

## 2019-07-23 ENCOUNTER — Encounter (HOSPITAL_COMMUNITY): Payer: Self-pay | Admitting: Obstetrics and Gynecology

## 2019-07-23 ENCOUNTER — Inpatient Hospital Stay (HOSPITAL_COMMUNITY)
Admission: AD | Admit: 2019-07-23 | Discharge: 2019-07-23 | Disposition: A | Discharge status: Home / Self Care | Payer: BC Managed Care – PPO | Source: Ambulatory Visit | Attending: Obstetrics and Gynecology | Admitting: Obstetrics and Gynecology

## 2019-07-23 ENCOUNTER — Other Ambulatory Visit: Payer: Self-pay

## 2019-07-23 DIAGNOSIS — Z880 Allergy status to penicillin: Secondary | ICD-10-CM | POA: Insufficient documentation

## 2019-07-23 DIAGNOSIS — W009XXA Unspecified fall due to ice and snow, initial encounter: Secondary | ICD-10-CM | POA: Diagnosis present

## 2019-07-23 DIAGNOSIS — Z3A12 12 weeks gestation of pregnancy: Secondary | ICD-10-CM | POA: Diagnosis not present

## 2019-07-23 DIAGNOSIS — O9A211 Injury, poisoning and certain other consequences of external causes complicating pregnancy, first trimester: Secondary | ICD-10-CM | POA: Diagnosis not present

## 2019-07-23 DIAGNOSIS — M79603 Pain in arm, unspecified: Secondary | ICD-10-CM | POA: Diagnosis not present

## 2019-07-23 DIAGNOSIS — Z87891 Personal history of nicotine dependence: Secondary | ICD-10-CM | POA: Diagnosis not present

## 2019-07-23 DIAGNOSIS — M542 Cervicalgia: Secondary | ICD-10-CM | POA: Diagnosis not present

## 2019-07-23 DIAGNOSIS — O26891 Other specified pregnancy related conditions, first trimester: Secondary | ICD-10-CM | POA: Insufficient documentation

## 2019-07-23 DIAGNOSIS — W000XXA Fall on same level due to ice and snow, initial encounter: Secondary | ICD-10-CM | POA: Diagnosis not present

## 2019-07-23 DIAGNOSIS — Z7722 Contact with and (suspected) exposure to environmental tobacco smoke (acute) (chronic): Secondary | ICD-10-CM | POA: Diagnosis not present

## 2019-07-23 LAB — URINALYSIS, ROUTINE W REFLEX MICROSCOPIC
Bacteria, UA: NONE SEEN
Bilirubin Urine: NEGATIVE
Glucose, UA: NEGATIVE mg/dL
Hgb urine dipstick: NEGATIVE
Ketones, ur: NEGATIVE mg/dL
Nitrite: NEGATIVE
Protein, ur: NEGATIVE mg/dL
Specific Gravity, Urine: 1.026 (ref 1.005–1.030)
pH: 6 (ref 5.0–8.0)

## 2019-07-23 MED ORDER — CYCLOBENZAPRINE HCL 10 MG PO TABS: 10.0000 mg | ORAL_TABLET | Freq: Two times a day (BID) | ORAL | 0 refills | Status: DC | PRN

## 2019-07-23 MED ORDER — CYCLOBENZAPRINE HCL 10 MG PO TABS
10.0000 mg | ORAL_TABLET | Freq: Once | ORAL | Status: AC
  Administered 2019-07-23: 10 mg via ORAL
  Filled 2019-07-23: qty 1

## 2019-07-23 NOTE — MAU Note (Signed)
Pt reports she fell on her back Christmas Day at [place] she slipped on ice and hit her butt. Pt was having back pain that went away, but came back yesterday. States arms and neck hurt. Did not hit head or lose consciousness. Denies bleeding. Has not tried anything for pain.

## 2019-07-23 NOTE — MAU Provider Note (Signed)
History     CSN: [id number]  Arrival date and time: 07/23/19 0539   First Provider Initiated Contact with Patient 07/23/19 [phone]      Chief Complaint  Patient presents with  . Fall  . Back Pain   HPI  Ms.  Rebekah Sanders is a 18 y.o. year old G61P0 female at [redacted]w[redacted]d weeks gestation who presents to MAU reporting she fell on her back on Christmas Day around 2000. She reports she fell when she slipped on some ice outside of her mother's house. She landed on her butt. She denies hitting her head or any LOC. She reports she had back pain that went away. She reports that she had no pain until today her arm and neck hurt. She states she came to get checked out, because her mother told her that she "could be pregnant in her back." She denies vaginal bleeding. She has not taken any medications for pain since the fall.  Past Medical History:  Diagnosis Date  . ADHD (attention deficit hyperactivity disorder)   . Asthma   . Auditory hallucinations   . Bipolar 1 disorder ([place])   . Oppositional defiant disorder     Past Surgical History:  Procedure Laterality Date  . TONSILLECTOMY AND ADENOIDECTOMY Bilateral 03/2016    Family History  Problem Relation Age of Onset  . Bipolar disorder Other        Maternal grandparents  . Depression Other        Parents  . Drug abuse Other        multiple family members by report  . Diabetes Mother     Social History   Tobacco Use  . Smoking status: Former Research scientist (life sciences)  . Smokeless tobacco: Never Used  . Tobacco comment: father smokes in home  Substance Use Topics  . Alcohol use: Not Currently    Comment: approx "a fifth of liquor" weekly on average  . Drug use: Not Currently    Types: Marijuana    Comment: Pt denies    Allergies:  Allergies  Allergen Reactions  . Penicillins Hives    Medications Prior to Admission  Medication Sig Dispense Refill Last Dose  . Prenatal Vit-Fe Fumarate-FA (PRENATAL COMPLETE) 14-0.4 MG TABS Take 1 tablet  by mouth daily. 60 tablet 0 07/22/2019 at 0800  . ferrous sulfate (FERROUSUL) 325 (65 FE) MG tablet Take 1 tablet (325 mg total) by mouth 2 (two) times daily with a meal. (Patient not taking: Reported on 09/04/2018) 60 tablet 3     Review of Systems  Constitutional: Negative.   HENT: Negative.   Eyes: Negative.   Respiratory: Negative.   Cardiovascular: Negative.   Gastrointestinal: Negative.   Endocrine: Negative.   Genitourinary: Negative.   Musculoskeletal: Positive for myalgias (arm) and neck pain.  Skin: Negative.   Allergic/Immunologic: Negative.   Neurological: Negative.   Hematological: Negative.   Psychiatric/Behavioral: Negative.    Physical Exam   Patient Vitals for the past 24 hrs:  BP Temp Temp src Pulse Resp SpO2 Height Weight  07/23/19 0700 112/90 - - (!) 108 - 100 % - -  07/23/19 0647 105/79 - - (!) 101 - - - -  07/23/19 0632 105/65 - - 85 - - - -  07/23/19 0616 (!) 148/66 - - 89 - - - -  07/23/19 0602 124/70 - - 100 - - - -  07/23/19 0540 (!) 147/79 98.2 F (36.8 C) Oral 98 18 100 % 5\' 7"  (1.702 m) 92.8 kg  Physical Exam  Nursing note and vitals reviewed. Constitutional: She is oriented to person, place, and time. She appears well-developed and well-nourished.  HENT:  Head: Normocephalic and atraumatic.  Eyes: Pupils are equal, round, and reactive to light.  Cardiovascular: Normal rate.  Respiratory: Effort normal.  GI: Soft.  Musculoskeletal:        General: No tenderness. Normal range of motion.     Cervical back: Normal range of motion.     Comments: No marked deficits noted  Neurological: She is alert and oriented to person, place, and time. She has normal reflexes.  Skin: Skin is warm and dry.  No bruises  Psychiatric: She has a normal mood and affect. Her behavior is normal. Judgment and thought content normal.   FHTs by doppler: 160 bpm  MAU Course  Procedures  MDM CCUA Flexeril 10 mg -- pain resolved  Results for orders placed or  performed during the hospital encounter of 07/23/19 (from the past 24 hour(s))  Urinalysis, Routine w reflex microscopic     Status: Abnormal   Collection Time: 07/23/19  6:02 AM  Result Value Ref Range   Color, Urine YELLOW YELLOW   APPearance CLEAR CLEAR   Specific Gravity, Urine 1.026 1.005 - 1.030   pH 6.0 5.0 - 8.0   Glucose, UA NEGATIVE NEGATIVE mg/dL   Hgb urine dipstick NEGATIVE NEGATIVE   Bilirubin Urine NEGATIVE NEGATIVE   Ketones, ur NEGATIVE NEGATIVE mg/dL   Protein, ur NEGATIVE NEGATIVE mg/dL   Nitrite NEGATIVE NEGATIVE   Leukocytes,Ua TRACE (A) NEGATIVE   RBC / HPF 0-5 0 - 5 RBC/hpf   WBC, UA 0-5 0 - 5 WBC/hpf   Bacteria, UA NONE SEEN NONE SEEN   Squamous Epithelial / LPF 0-5 0 - 5   Mucus PRESENT     Assessment and Plan  Fall due to slipping on ice or snow, initial encounter  - Reassurance given that baby has a healthy heartbeat - Discussed that uterus is not a pelvic organ at this gestation and is well protected by the pelvis. Meaning, the pelvis would have to be severely injured in order to cause potential harm to the uterus. - Information provided on preventing injuries in pregnancy   Neck pain without injury  - Rx for Flexeril 10 mg BID prn pain  - Discharge patient - Schedule [facility] ASAP - Patient verbalized an understanding of the plan of care and agrees.     Raelyn Mora, MSN, CNM 07/23/2019, 6:09 AM

## 2019-07-26 DIAGNOSIS — Z3480 Encounter for supervision of other normal pregnancy, unspecified trimester: Secondary | ICD-10-CM | POA: Diagnosis not present

## 2019-07-26 DIAGNOSIS — Z3685 Encounter for antenatal screening for Streptococcus B: Secondary | ICD-10-CM | POA: Diagnosis not present

## 2019-07-26 DIAGNOSIS — Z3481 Encounter for supervision of other normal pregnancy, first trimester: Secondary | ICD-10-CM | POA: Diagnosis not present

## 2019-07-26 LAB — OB RESULTS CONSOLE RPR: RPR: NONREACTIVE

## 2019-07-26 LAB — OB RESULTS CONSOLE HIV ANTIBODY (ROUTINE TESTING): HIV: NONREACTIVE

## 2019-07-26 LAB — OB RESULTS CONSOLE RUBELLA ANTIBODY, IGM: Rubella: IMMUNE

## 2019-07-26 LAB — OB RESULTS CONSOLE HEPATITIS B SURFACE ANTIGEN: Hepatitis B Surface Ag: NEGATIVE

## 2019-07-27 NOTE — L&D Delivery Note (Addendum)
° ° °  Delivery Note At 12:40 PM a viable female was delivered via Vaginal, Spontaneous (Presentation: Right Occiput Anterior).  APGAR: 8, 9; weight 7 lb 13.9 oz (3570 g).   Placenta status: Spontaneous, Intact.  Cord: 3 vessels with the following complications: None.  Cord pH: NA  Anesthesia: None Episiotomy: None Lacerations: Bilateral Labial Suture Repair: 3.0 vicryl Est. Blood Loss (mL):  154  Mom to postpartum.  Baby to Couplet care / Skin to Skin.  Lelon Mast 02/03/2020, 2:02 PM  Attestation of Supervision of Student:  I confirm that I have verified the information documented in the nurse midwife students note and that I have also personally reperformed the history, physical exam and all medical decision making activities.  I have verified that all services and findings are accurately documented in this student's note; and I agree with management and plan as outlined in the documentation. I have also made any necessary editorial changes.   Marylene Land, CNM [facility] [employer], [facility] 02/03/2020 3:25 PM   Called to the room by RN, mother delivered spontaneously on the bed without provider present. CNM and SNM immediately at the bedside to clamp and cut cord, placenta delivered spontaneously with gentle traction. Bilaterial labial tears repaired with 3-0 vicryl, EBL is 154.  Red rubber urine catheter placed briefly post repair to ensure patency of urethra.   Luna Kitchens

## 2019-07-31 DIAGNOSIS — Z3A13 13 weeks gestation of pregnancy: Secondary | ICD-10-CM | POA: Diagnosis not present

## 2019-07-31 DIAGNOSIS — Z36 Encounter for antenatal screening for chromosomal anomalies: Secondary | ICD-10-CM | POA: Diagnosis not present

## 2019-07-31 DIAGNOSIS — Z3682 Encounter for antenatal screening for nuchal translucency: Secondary | ICD-10-CM | POA: Diagnosis not present

## 2019-08-07 DIAGNOSIS — Z3482 Encounter for supervision of other normal pregnancy, second trimester: Secondary | ICD-10-CM | POA: Diagnosis not present

## 2019-08-07 DIAGNOSIS — Z124 Encounter for screening for malignant neoplasm of cervix: Secondary | ICD-10-CM | POA: Diagnosis not present

## 2019-08-07 DIAGNOSIS — Z3A18 18 weeks gestation of pregnancy: Secondary | ICD-10-CM | POA: Diagnosis not present

## 2019-08-07 DIAGNOSIS — Z363 Encounter for antenatal screening for malformations: Secondary | ICD-10-CM | POA: Diagnosis not present

## 2019-08-07 DIAGNOSIS — Z113 Encounter for screening for infections with a predominantly sexual mode of transmission: Secondary | ICD-10-CM | POA: Diagnosis not present

## 2019-08-07 DIAGNOSIS — Z3402 Encounter for supervision of normal first pregnancy, second trimester: Secondary | ICD-10-CM | POA: Diagnosis not present

## 2019-08-07 DIAGNOSIS — Z3481 Encounter for supervision of other normal pregnancy, first trimester: Secondary | ICD-10-CM | POA: Diagnosis not present

## 2019-09-10 DIAGNOSIS — Z363 Encounter for antenatal screening for malformations: Secondary | ICD-10-CM | POA: Diagnosis not present

## 2019-09-10 DIAGNOSIS — Z3A18 18 weeks gestation of pregnancy: Secondary | ICD-10-CM | POA: Diagnosis not present

## 2019-11-07 DIAGNOSIS — Z3480 Encounter for supervision of other normal pregnancy, unspecified trimester: Secondary | ICD-10-CM | POA: Diagnosis not present

## 2019-12-03 NOTE — Telephone Encounter (Signed)
This encounter was created in error - please disregard.

## 2019-12-05 ENCOUNTER — Other Ambulatory Visit: Payer: Self-pay

## 2019-12-05 ENCOUNTER — Encounter (HOSPITAL_COMMUNITY): Payer: Self-pay | Admitting: Emergency Medicine

## 2019-12-05 ENCOUNTER — Emergency Department (HOSPITAL_COMMUNITY)
Admission: EM | Admit: 2019-12-05 | Discharge: 2019-12-05 | Disposition: A | Discharge status: Home / Self Care | Payer: Medicaid Other | Attending: Emergency Medicine | Admitting: Emergency Medicine

## 2019-12-05 DIAGNOSIS — O99891 Other specified diseases and conditions complicating pregnancy: Secondary | ICD-10-CM | POA: Diagnosis not present

## 2019-12-05 DIAGNOSIS — M25532 Pain in left wrist: Secondary | ICD-10-CM

## 2019-12-05 DIAGNOSIS — F909 Attention-deficit hyperactivity disorder, unspecified type: Secondary | ICD-10-CM | POA: Insufficient documentation

## 2019-12-05 DIAGNOSIS — O26893 Other specified pregnancy related conditions, third trimester: Secondary | ICD-10-CM | POA: Diagnosis not present

## 2019-12-05 DIAGNOSIS — J45909 Unspecified asthma, uncomplicated: Secondary | ICD-10-CM | POA: Diagnosis not present

## 2019-12-05 DIAGNOSIS — Z3A31 31 weeks gestation of pregnancy: Secondary | ICD-10-CM | POA: Diagnosis not present

## 2019-12-05 DIAGNOSIS — Z87891 Personal history of nicotine dependence: Secondary | ICD-10-CM | POA: Insufficient documentation

## 2019-12-05 NOTE — ED Provider Notes (Signed)
[name] EMERGENCY DEPARTMENT Provider Note   CSN: [id number] Arrival date & time: 12/05/19  1109     History Chief Complaint  Patient presents with  . Wrist Pain    Rebekah Sanders is a 19 y.o. female.  19 y/o F who is [redacted] weeks pregnant presents with atraumatic left wrist pain for 1.5 months. Pain worse with movement of the wrist. Denies numbness, tingling, weakness, swelling. Has not tried anything for relief.         Past Medical History:  Diagnosis Date  . ADHD (attention deficit hyperactivity disorder)   . Asthma   . Auditory hallucinations   . Bipolar 1 disorder ([place])   . Oppositional defiant disorder     Patient Active Problem List   Diagnosis Date Noted  . Fall due to ice or snow 07/23/2019  . Neck pain without injury 07/23/2019  . Trichomoniasis 07/12/2019  . Low ferritin 03/03/2018  . Acne vulgaris 03/03/2018  . History of trichomoniasis 03/03/2018  . History of gonorrhea 03/03/2018  . History of chlamydia 03/03/2018  . OSA (obstructive sleep apnea) 05/26/2016  . Screening for STD (sexually transmitted disease) 03/13/2013  . Severe bipolar I disorder, current or most recent episode mixed ([place]) 08/08/2012    Class: Chronic  . ODD (oppositional defiant disorder) 08/08/2012    Class: Chronic  . ADHD (attention deficit hyperactivity disorder), combined type 08/08/2012    Past Surgical History:  Procedure Laterality Date  . TONSILLECTOMY AND ADENOIDECTOMY Bilateral 03/2016     OB History    Gravida  1   Para      Term      Preterm      AB      Living        SAB      TAB      Ectopic      Multiple      Live Births              Family History  Problem Relation Age of Onset  . Bipolar disorder Other        Maternal grandparents  . Depression Other        Parents  . Drug abuse Other        multiple family members by report  . Diabetes Mother     Social History   Tobacco Use  . Smoking status:  Former Research scientist (life sciences)  . Smokeless tobacco: Never Used  . Tobacco comment: father smokes in home  Substance Use Topics  . Alcohol use: Not Currently    Comment: approx "a fifth of liquor" weekly on average  . Drug use: Not Currently    Types: Marijuana    Comment: Pt denies    Home Medications Prior to Admission medications   Medication Sig Start Date End Date Taking? Authorizing Provider  cyclobenzaprine (FLEXERIL) 10 MG tablet Take 1 tablet (10 mg total) by mouth 2 (two) times daily as needed for muscle spasms. 07/23/19   Laury Deep, CNM  ferrous sulfate (FERROUSUL) 325 (65 FE) MG tablet Take 1 tablet (325 mg total) by mouth 2 (two) times daily with a meal. Patient not taking: Reported on 09/04/2018 03/02/18   Trude Mcburney, FNP  Prenatal Vit-Fe Fumarate-FA (PRENATAL COMPLETE) 14-0.4 MG TABS Take 1 tablet by mouth daily. 06/05/19   Charlann Lange, PA-C    Allergies    Penicillins  Review of Systems   Review of Systems  Constitutional: Negative for fever.  Musculoskeletal: Positive  for arthralgias. Negative for joint swelling and myalgias.  Skin: Negative for rash and wound.  Neurological: Negative for numbness.    Physical Exam Updated Vital Signs BP 126/71 (BP Location: Left Arm)   Pulse (!) 104   Temp 98.5 F (36.9 C) (Oral)   Resp 18   Ht 5\' 7"  (1.702 m)   LMP 05/02/2019   SpO2 97%   BMI 32.03 kg/m   Physical Exam Vitals and nursing note reviewed.  Constitutional:      General: She is not in acute distress.    Appearance: Normal appearance. She is not ill-appearing, toxic-appearing or diaphoretic.  HENT:     Head: Normocephalic.  Eyes:     Conjunctiva/sclera: Conjunctivae normal.  Pulmonary:     Effort: Pulmonary effort is normal.  Musculoskeletal:     Left wrist: Tenderness present. No swelling, deformity, lacerations, bony tenderness or snuff box tenderness. Normal range of motion.     Comments: Pain reproduced with radial and ulnar deviation and  flexion. +finklestein test.  Skin:    General: Skin is dry.     Findings: No bruising or rash.  Neurological:     Mental Status: She is alert.     Sensory: No sensory deficit.     Motor: No weakness.  Psychiatric:        Mood and Affect: Mood normal.     ED Results / Procedures / Treatments   Labs (all labs ordered are listed, but only abnormal results are displayed) Labs Reviewed - No data to display  EKG None  Radiology No results found.  Procedures Procedures (including critical care time)  Medications Ordered in ED Medications - No data to display  ED Course  I have reviewed the triage vital signs and the nursing notes.  Pertinent labs & imaging results that were available during my care of the patient were reviewed by me and considered in my medical decision making (see chart for details).  Clinical Course as of Dec 05 1219  Wed Dec 05, 2019  1219 Atraumatic writs pain for months, consistent wit ha tendonitis. No need for further imaging at this time. Given a wrist brace. Only tylenol due to pregnancy/    [KM]    Clinical Course User Index [KM] 1220   MDM Rules/Calculators/A&P                      Based on review of vitals, medical screening exam, lab work and/or imaging, there does not appear to be an acute, emergent etiology for the patient's symptoms. Counseled pt on good return precautions and encouraged both PCP and ED follow-up as needed.  Prior to discharge, I also discussed incidental imaging findings with patient in detail and advised appropriate, recommended follow-up in detail.  Clinical Impression: 1. Left wrist pain     Disposition: Discharge  Prior to providing a prescription for a controlled substance, I independently reviewed the patient's recent prescription history on the [place] Controlled Substance Reporting System. The patient had no recent or regular prescriptions and was deemed appropriate for a brief, less  than 3 day prescription of narcotic for acute analgesia.  This note was prepared with assistance of [place]. Occasional wrong-word or sound-a-like substitutions may have occurred due to the inherent limitations of voice recognition software.  Final Clinical Impression(s) / ED Diagnoses Final diagnoses:  Left wrist pain    Rx / DC Orders ED Discharge Orders  None       [place] 12/05/19 1221    Vanetta Mulders, MD 12/08/19 [phone]

## 2019-12-05 NOTE — Discharge Instructions (Signed)
You were seen today for wrist pain. We think you have a tendonitis. This is treat with rest, ice and a brace. Take only tylenol due to your pregnancy. Follow up with your regular doctor.

## 2019-12-05 NOTE — ED Notes (Signed)
Left wrist "popping" since last month. No swelling, states "it was out of place this am, had to shake it back in place"

## 2019-12-05 NOTE — ED Triage Notes (Addendum)
C/o L wrist pain x 1 1/2 months.  States it keeps "popping out of place".  Denies pain at present. Pt is [redacted] weeks pregnant.

## 2019-12-05 NOTE — Progress Notes (Signed)
Orthopedic Tech Progress Note Patient Details:  Rebekah Sanders [birth year] [id number]  Ortho Devices Type of Ortho Device: Thumb velcro splint Ortho Device/Splint Location: LUE Ortho Device/Splint Interventions: Ordered, Application   Post Interventions Patient Tolerated: Well Instructions Provided: Adjustment of device, Care of device   Glover Capano 12/05/2019, 12:33 PM

## 2020-01-24 ENCOUNTER — Encounter (HOSPITAL_COMMUNITY): Payer: Self-pay | Admitting: Obstetrics and Gynecology

## 2020-01-24 ENCOUNTER — Inpatient Hospital Stay (HOSPITAL_COMMUNITY)
Admission: AD | Admit: 2020-01-24 | Discharge: 2020-01-24 | Disposition: A | Discharge status: Home / Self Care | Payer: Medicaid Other | Attending: Obstetrics and Gynecology | Admitting: Obstetrics and Gynecology

## 2020-01-24 DIAGNOSIS — O0933 Supervision of pregnancy with insufficient antenatal care, third trimester: Secondary | ICD-10-CM

## 2020-01-24 DIAGNOSIS — M549 Dorsalgia, unspecified: Secondary | ICD-10-CM | POA: Insufficient documentation

## 2020-01-24 DIAGNOSIS — Z880 Allergy status to penicillin: Secondary | ICD-10-CM | POA: Insufficient documentation

## 2020-01-24 DIAGNOSIS — Z3A38 38 weeks gestation of pregnancy: Secondary | ICD-10-CM | POA: Diagnosis not present

## 2020-01-24 DIAGNOSIS — F419 Anxiety disorder, unspecified: Secondary | ICD-10-CM | POA: Insufficient documentation

## 2020-01-24 DIAGNOSIS — Z87891 Personal history of nicotine dependence: Secondary | ICD-10-CM | POA: Diagnosis not present

## 2020-01-24 DIAGNOSIS — Z3689 Encounter for other specified antenatal screening: Secondary | ICD-10-CM

## 2020-01-24 DIAGNOSIS — O26893 Other specified pregnancy related conditions, third trimester: Secondary | ICD-10-CM | POA: Diagnosis present

## 2020-01-24 DIAGNOSIS — O99891 Other specified diseases and conditions complicating pregnancy: Secondary | ICD-10-CM

## 2020-01-24 DIAGNOSIS — R102 Pelvic and perineal pain: Secondary | ICD-10-CM | POA: Diagnosis not present

## 2020-01-24 DIAGNOSIS — A749 Chlamydial infection, unspecified: Secondary | ICD-10-CM | POA: Insufficient documentation

## 2020-01-24 DIAGNOSIS — O99343 Other mental disorders complicating pregnancy, third trimester: Secondary | ICD-10-CM | POA: Insufficient documentation

## 2020-01-24 DIAGNOSIS — M542 Cervicalgia: Secondary | ICD-10-CM

## 2020-01-24 DIAGNOSIS — W009XXA Unspecified fall due to ice and snow, initial encounter: Secondary | ICD-10-CM

## 2020-01-24 HISTORY — DX: Chlamydial infection, unspecified: A74.9

## 2020-01-24 HISTORY — DX: Gonococcal infection, unspecified: A54.9

## 2020-01-24 HISTORY — DX: Trichomoniasis, unspecified: A59.9

## 2020-01-24 LAB — URINALYSIS, ROUTINE W REFLEX MICROSCOPIC
Bilirubin Urine: NEGATIVE
Glucose, UA: NEGATIVE mg/dL
Hgb urine dipstick: NEGATIVE
Ketones, ur: NEGATIVE mg/dL
Leukocytes,Ua: NEGATIVE
Nitrite: NEGATIVE
Protein, ur: NEGATIVE mg/dL
Specific Gravity, Urine: 1.012 (ref 1.005–1.030)
pH: 8 (ref 5.0–8.0)

## 2020-01-24 NOTE — Progress Notes (Signed)
Pt given labor precautions.

## 2020-01-24 NOTE — MAU Provider Note (Signed)
History     CSN: [id number]  Arrival date and time: 01/24/20 1443   First Provider Initiated Contact with Patient 01/24/20 1550      Chief Complaint  Patient presents with  . Abdominal Pain   19 y.o. G1 @38 .5 wks presenting for pelvic pain and back pain. Reports onset last night. Pain is sharp and lasts seconds to 1 min. No precipitating factors. Denies urinary sx. Denies VB or discharge. Reports good FM. Was getting care at [facility] until April but then lost insurance and has not been seen since. Her pregnancy is complicated by anxiety, elevate Hgb F, and GBS bacteriuria.   OB History    Gravida  1   Para      Term      Preterm      AB      Living        SAB      TAB      Ectopic      Multiple      Live Births              Past Medical History:  Diagnosis Date  . ADHD (attention deficit hyperactivity disorder)   . Asthma   . Auditory hallucinations   . Bipolar 1 disorder (HCC)   . Oppositional defiant disorder     Past Surgical History:  Procedure Laterality Date  . TONSILLECTOMY AND ADENOIDECTOMY Bilateral 03/2016    Family History  Problem Relation Age of Onset  . Bipolar disorder Other        Maternal grandparents  . Depression Other        Parents  . Drug abuse Other        multiple family members by report  . Diabetes Mother     Social History   Tobacco Use  . Smoking status: Former 04/2016  . Smokeless tobacco: Never Used  . Tobacco comment: father smokes in home  Vaping Use  . Vaping Use: Never used  Substance Use Topics  . Alcohol use: Not Currently    Comment: approx "a fifth of liquor" weekly on average  . Drug use: Not Currently    Types: Marijuana    Comment: Pt denies    Allergies:  Allergies  Allergen Reactions  . Penicillins Hives    No medications prior to admission.    Review of Systems  Gastrointestinal: Negative for abdominal pain.  Genitourinary: Positive for pelvic pain. Negative for  dysuria, frequency, hematuria, urgency, vaginal bleeding and vaginal discharge.  Musculoskeletal: Positive for back pain.   Physical Exam   Blood pressure 134/74, pulse 100, temperature 98.2 F (36.8 C), temperature source Oral, resp. rate 18, height 5\' 7"  (1.702 m), weight 117.1 kg, last menstrual period 05/02/2019, SpO2 99 %.  Physical Exam Vitals and nursing note reviewed. Exam conducted with a chaperone present.  Constitutional:      General: She is not in acute distress.    Appearance: She is well-developed.  HENT:     Head: Normocephalic.  Pulmonary:     Effort: Pulmonary effort is normal. No respiratory distress.  Genitourinary:    Comments: VE: 1/60/-2, vtx Skin:    General: Skin is warm and dry.  Neurological:     General: No focal deficit present.     Mental Status: She is alert and oriented to person, place, and time.  Psychiatric:        Mood and Affect: Mood normal.   EFM: 135 bpm, mod variability, +  accels, no decels Toco: rare  Results for orders placed or performed during the hospital encounter of 01/24/20 (from the past 24 hour(s))  Urinalysis, Routine w reflex microscopic     Status: None   Collection Time: 01/24/20  3:30 PM  Result Value Ref Range   Color, Urine YELLOW YELLOW   APPearance CLEAR CLEAR   Specific Gravity, Urine 1.012 1.005 - 1.030   pH 8.0 5.0 - 8.0   Glucose, UA NEGATIVE NEGATIVE mg/dL   Hgb urine dipstick NEGATIVE NEGATIVE   Bilirubin Urine NEGATIVE NEGATIVE   Ketones, ur NEGATIVE NEGATIVE mg/dL   Protein, ur NEGATIVE NEGATIVE mg/dL   Nitrite NEGATIVE NEGATIVE   Leukocytes,Ua NEGATIVE NEGATIVE   MAU Course  Procedures  MDM Labs ordered and reviewed. GBS in urine, PCN allergic (hives) but can take cephalosporins. No signs or UTI or labor. Pain likely physiologic to late pregnancy. Discussed comfort measures.   Assessment and Plan  [redacted] weeks gestation Reactive NST Pelvic pain in pregnancy Back pain in pregnancy Discharge  home Follow up for care at [facility] asap-message sent Labor precautions  Allergies as of 01/24/2020      Reactions   Penicillins Hives      Medication List    STOP taking these medications   ferrous sulfate 325 (65 FE) MG tablet Commonly known as: FerrouSul     TAKE these medications   cyclobenzaprine 10 MG tablet Commonly known as: FLEXERIL Take 1 tablet (10 mg total) by mouth 2 (two) times daily as needed for muscle spasms.   Prenatal Complete 14-0.4 MG Tabs Take 1 tablet by mouth daily.      Donette Larry, CNM 01/24/2020, 4:57 PM

## 2020-01-24 NOTE — MAU Note (Signed)
Pt c/o of intermittent sharp abdominal pain since last night. Also feeling pain "inside her vagina." Denies VB or LOF, +FM. Has had no prenatal care since 26 wks.

## 2020-01-24 NOTE — Discharge Instructions (Signed)
Braxton Hicks Contractions Contractions of the uterus can occur throughout pregnancy, but they are not always a sign that you are in labor. You may have practice contractions called Braxton Hicks contractions. These false labor contractions are sometimes confused with true labor. What are Braxton Hicks contractions? Braxton Hicks contractions are tightening movements that occur in the muscles of the uterus before labor. Unlike true labor contractions, these contractions do not result in opening (dilation) and thinning of the cervix. Toward the end of pregnancy (32-34 weeks), Braxton Hicks contractions can happen more often and may become stronger. These contractions are sometimes difficult to tell apart from true labor because they can be very uncomfortable. You should not feel embarrassed if you go to the hospital with false labor. Sometimes, the only way to tell if you are in true labor is for your health care provider to look for changes in the cervix. The health care provider will do a physical exam and may monitor your contractions. If you are not in true labor, the exam should show that your cervix is not dilating and your water has not broken. If there are no other health problems associated with your pregnancy, it is completely safe for you to be sent home with false labor. You may continue to have Braxton Hicks contractions until you go into true labor. How to tell the difference between true labor and false labor True labor  Contractions last 30-70 seconds.  Contractions become very regular.  Discomfort is usually felt in the top of the uterus, and it spreads to the lower abdomen and low back.  Contractions do not go away with walking.  Contractions usually become more intense and increase in frequency.  The cervix dilates and gets thinner. False labor  Contractions are usually shorter and not as strong as true labor contractions.  Contractions are usually irregular.  Contractions  are often felt in the front of the lower abdomen and in the groin.  Contractions may go away when you walk around or change positions while lying down.  Contractions get weaker and are shorter-lasting as time goes on.  The cervix usually does not dilate or become thin. Follow these instructions at home:   Take over-the-counter and prescription medicines only as told by your health care provider.  Keep up with your usual exercises and follow other instructions from your health care provider.  Eat and drink lightly if you think you are going into labor.  If Braxton Hicks contractions are making you uncomfortable: ? Change your position from lying down or resting to walking, or change from walking to resting. ? Sit and rest in a tub of warm water. ? Drink enough fluid to keep your urine pale yellow. Dehydration may cause these contractions. ? Do slow and deep breathing several times an hour.  Keep all follow-up prenatal visits as told by your health care provider. This is important. Contact a health care provider if:  You have a fever.  You have continuous pain in your abdomen. Get help right away if:  Your contractions become stronger, more regular, and closer together.  You have fluid leaking or gushing from your vagina.  You pass blood-tinged mucus (bloody show).  You have bleeding from your vagina.  You have low back pain that you never had before.  You feel your baby's head pushing down and causing pelvic pressure.  Your baby is not moving inside you as much as it used to. Summary  Contractions that occur before labor are   called Braxton Hicks contractions, false labor, or practice contractions.  Braxton Hicks contractions are usually shorter, weaker, farther apart, and less regular than true labor contractions. True labor contractions usually become progressively stronger and regular, and they become more frequent.  Manage discomfort from [facility] contractions  by changing position, resting in a warm bath, drinking plenty of water, or practicing deep breathing. This information is not intended to replace advice given to you by your health care provider. Make sure you discuss any questions you have with your health care provider. Document Revised: 06/24/2017 Document Reviewed: 11/25/2016 Elsevier Patient Education  [employer].   Back Pain in Pregnancy Back pain during pregnancy is common. Back pain may be caused by several factors that are related to changes during your pregnancy. Follow these instructions at home: Managing pain, stiffness, and swelling      If directed, for sudden (acute) back pain, put ice on the painful area. ? Put ice in a plastic bag. ? Place a towel between your skin and the bag. ? Leave the ice on for 20 minutes, 2-3 times per day.  If directed, apply heat to the affected area before you exercise. Use the heat source that your health care provider recommends, such as a moist heat pack or a heating pad. ? Place a towel between your skin and the heat source. ? Leave the heat on for 20-30 minutes. ? Remove the heat if your skin turns bright red. This is especially important if you are unable to feel pain, heat, or cold. You may have a greater risk of getting burned.  If directed, massage the affected area. Activity  Exercise as told by your health care provider. Gentle exercise is the best way to prevent or manage back pain.  Listen to your body when lifting. If lifting hurts, ask for help or bend your knees. This uses your leg muscles instead of your back muscles.  Squat down when picking up something from the floor. Do not bend over.  Only use bed rest for short periods as told by your health care provider. Bed rest should only be used for the most severe episodes of back pain. Standing, sitting, and lying down  Do not stand in one place for long periods of time.  Use good posture when sitting. Make sure  your head rests over your shoulders and is not hanging forward. Use a pillow on your lower back if necessary.  Try sleeping on your side, preferably the left side, with a pregnancy support pillow or 1-2 regular pillows between your legs. ? If you have back pain after a night's rest, your bed may be too soft. ? A firm mattress may provide more support for your back during pregnancy. General instructions  Do not wear high heels.  Eat a healthy diet. Try to gain weight within your health care provider's recommendations.  Use a maternity girdle, elastic sling, or back brace as told by your health care provider.  Take over-the-counter and prescription medicines only as told by your health care provider.  Work with a physical therapist or massage therapist to find ways to manage back pain. Acupuncture or massage therapy may be helpful.  Keep all follow-up visits as told by your health care provider. This is important. Contact a health care provider if:  Your back pain interferes with your daily activities.  You have increasing pain in other parts of your body. Get help right away if:  You develop numbness, tingling, weakness,  or problems with the use of your arms or legs.  You develop severe back pain that is not controlled with medicine.  You have a change in bowel or bladder control.  You develop shortness of breath, dizziness, or you faint.  You develop nausea, vomiting, or sweating.  You have back pain that is a rhythmic, cramping pain similar to labor pains. Labor pain is usually 1-2 minutes apart, lasts for about 1 minute, and involves a bearing down feeling or pressure in your pelvis.  You have back pain and your water breaks or you have vaginal bleeding.  You have back pain or numbness that travels down your leg.  Your back pain developed after you fell.  You develop pain on one side of your back.  You see blood in your urine.  You develop skin blisters in the area of  your back pain. Summary  Back pain may be caused by several factors that are related to changes during your pregnancy.  Follow instructions as told by your health care provider for managing pain, stiffness, and swelling.  Exercise as told by your health care provider. Gentle exercise is the best way to prevent or manage back pain.  Take over-the-counter and prescription medicines only as told by your health care provider.  Keep all follow-up visits as told by your health care provider. This is important. This information is not intended to replace advice given to you by your health care provider. Make sure you discuss any questions you have with your health care provider. Document Revised: 10/31/2018 Document Reviewed: 12/28/2017 Elsevier Patient Education  2020 [employer]   Fetal Movement Counts Patient Name: ________________________________________________ Patient Due Date: ____________________ What is a fetal movement count?  A fetal movement count is the number of times that you feel your baby move during a certain amount of time. This may also be called a fetal kick count. A fetal movement count is recommended for every pregnant woman. You may be asked to start counting fetal movements as early as week 28 of your pregnancy. Pay attention to when your baby is most active. You may notice your baby's sleep and wake cycles. You may also notice things that make your baby move more. You should do a fetal movement count:  When your baby is normally most active.  At the same time each day. A good time to count movements is while you are resting, after having something to eat and drink. How do I count fetal movements? 1. Find a quiet, comfortable area. Sit, or lie down on your side. 2. Write down the date, the start time and stop time, and the number of movements that you felt between those two times. Take this information with you to your health care visits. 3. Write down your start  time when you feel the first movement. 4. Count kicks, flutters, swishes, rolls, and jabs. You should feel at least 10 movements. 5. You may stop counting after you have felt 10 movements, or if you have been counting for 2 hours. Write down the stop time. 6. If you do not feel 10 movements in 2 hours, contact your health care provider for further instructions. Your health care provider may want to do additional tests to assess your baby's well-being. Contact a health care provider if:  You feel fewer than 10 movements in 2 hours.  Your baby is not moving like he or she usually does. Date: ____________ Start time: ____________ Stop time: ____________ Movements: ____________ Date:  ____________ Start time: ____________ Stop time: ____________ Movements: ____________ Date: ____________ Start time: ____________ Stop time: ____________ Movements: ____________ Date: ____________ Start time: ____________ Stop time: ____________ Movements: ____________ Date: ____________ Start time: ____________ Stop time: ____________ Movements: ____________ Date: ____________ Start time: ____________ Stop time: ____________ Movements: ____________ Date: ____________ Start time: ____________ Stop time: ____________ Movements: ____________ Date: ____________ Start time: ____________ Stop time: ____________ Movements: ____________ Date: ____________ Start time: ____________ Stop time: ____________ Movements: ____________ This information is not intended to replace advice given to you by your health care provider. Make sure you discuss any questions you have with your health care provider. Document Revised: 03/01/2019 Document Reviewed: 03/01/2019 Elsevier Patient Education  2020 [employer].

## 2020-02-02 ENCOUNTER — Inpatient Hospital Stay (HOSPITAL_BASED_OUTPATIENT_CLINIC_OR_DEPARTMENT_OTHER): Payer: Medicaid Other

## 2020-02-02 ENCOUNTER — Encounter (HOSPITAL_COMMUNITY): Payer: Self-pay | Admitting: Obstetrics & Gynecology

## 2020-02-02 ENCOUNTER — Inpatient Hospital Stay (HOSPITAL_COMMUNITY)
Admission: AD | Admit: 2020-02-02 | Discharge: 2020-02-05 | DRG: 807 | Disposition: A | Discharge status: Home / Self Care | Payer: Medicaid Other | Attending: Obstetrics and Gynecology | Admitting: Obstetrics and Gynecology

## 2020-02-02 ENCOUNTER — Other Ambulatory Visit: Payer: Self-pay

## 2020-02-02 DIAGNOSIS — O99824 Streptococcus B carrier state complicating childbirth: Principal | ICD-10-CM | POA: Diagnosis present

## 2020-02-02 DIAGNOSIS — Z20822 Contact with and (suspected) exposure to covid-19: Secondary | ICD-10-CM | POA: Diagnosis present

## 2020-02-02 DIAGNOSIS — Z3480 Encounter for supervision of other normal pregnancy, unspecified trimester: Secondary | ICD-10-CM

## 2020-02-02 DIAGNOSIS — Z3A40 40 weeks gestation of pregnancy: Secondary | ICD-10-CM

## 2020-02-02 DIAGNOSIS — Z880 Allergy status to penicillin: Secondary | ICD-10-CM

## 2020-02-02 DIAGNOSIS — O26893 Other specified pregnancy related conditions, third trimester: Secondary | ICD-10-CM | POA: Diagnosis present

## 2020-02-02 DIAGNOSIS — Z3A39 39 weeks gestation of pregnancy: Secondary | ICD-10-CM | POA: Diagnosis not present

## 2020-02-02 DIAGNOSIS — Z87891 Personal history of nicotine dependence: Secondary | ICD-10-CM

## 2020-02-02 DIAGNOSIS — Z3689 Encounter for other specified antenatal screening: Secondary | ICD-10-CM

## 2020-02-02 DIAGNOSIS — O368330 Maternal care for abnormalities of the fetal heart rate or rhythm, third trimester, not applicable or unspecified: Secondary | ICD-10-CM

## 2020-02-02 DIAGNOSIS — O99893 Other specified diseases and conditions complicating puerperium: Secondary | ICD-10-CM

## 2020-02-02 DIAGNOSIS — O368390 Maternal care for abnormalities of the fetal heart rate or rhythm, unspecified trimester, not applicable or unspecified: Secondary | ICD-10-CM

## 2020-02-02 DIAGNOSIS — O0933 Supervision of pregnancy with insufficient antenatal care, third trimester: Secondary | ICD-10-CM

## 2020-02-02 LAB — SARS CORONAVIRUS 2 BY RT PCR (HOSPITAL ORDER, PERFORMED IN ~~LOC~~ HOSPITAL LAB): SARS Coronavirus 2: NEGATIVE

## 2020-02-02 LAB — CBC
HCT: 33.8 % — ABNORMAL LOW (ref 36.0–46.0)
Hemoglobin: 10.7 g/dL — ABNORMAL LOW (ref 12.0–15.0)
MCH: 27.9 pg (ref 26.0–34.0)
MCHC: 31.7 g/dL (ref 30.0–36.0)
MCV: 88 fL (ref 80.0–100.0)
Platelets: 339 10*3/uL (ref 150–400)
RBC: 3.84 MIL/uL — ABNORMAL LOW (ref 3.87–5.11)
RDW: 16.3 % — ABNORMAL HIGH (ref 11.5–15.5)
WBC: 10.6 10*3/uL — ABNORMAL HIGH (ref 4.0–10.5)
nRBC: 0 % (ref 0.0–0.2)

## 2020-02-02 LAB — WET PREP, GENITAL
Clue Cells Wet Prep HPF POC: NONE SEEN
Sperm: NONE SEEN
Trich, Wet Prep: NONE SEEN
Yeast Wet Prep HPF POC: NONE SEEN

## 2020-02-02 LAB — TYPE AND SCREEN
ABO/RH(D): A POS
Antibody Screen: NEGATIVE

## 2020-02-02 LAB — AMNISURE RUPTURE OF MEMBRANE (ROM) NOT AT ARMC: Amnisure ROM: NEGATIVE

## 2020-02-02 LAB — ABO/RH: ABO/RH(D): A POS

## 2020-02-02 MED ORDER — OXYTOCIN-SODIUM CHLORIDE 30-0.9 UT/500ML-% IV SOLN
1.0000 m[IU]/min | INTRAVENOUS | Status: DC
  Administered 2020-02-03: 2 m[IU]/min via INTRAVENOUS
  Filled 2020-02-02: qty 500

## 2020-02-02 MED ORDER — LIDOCAINE HCL (PF) 1 % IJ SOLN
30.0000 mL | INTRAMUSCULAR | Status: AC | PRN
  Administered 2020-02-03 (×2): 30 mL via SUBCUTANEOUS
  Filled 2020-02-02: qty 30

## 2020-02-02 MED ORDER — ACETAMINOPHEN 325 MG PO TABS: 650.0000 mg | ORAL_TABLET | ORAL | Status: DC | PRN

## 2020-02-02 MED ORDER — OXYTOCIN-SODIUM CHLORIDE 30-0.9 UT/500ML-% IV SOLN: 2.5000 [IU]/h | INTRAVENOUS | Status: DC

## 2020-02-02 MED ORDER — SOD CITRATE-CITRIC ACID 500-334 MG/5ML PO SOLN
30.0000 mL | ORAL | Status: DC | PRN
  Administered 2020-02-02 – 2020-02-03 (×2): 30 mL via ORAL
  Filled 2020-02-02 (×2): qty 30

## 2020-02-02 MED ORDER — FENTANYL CITRATE (PF) 100 MCG/2ML IJ SOLN
100.0000 ug | INTRAMUSCULAR | Status: DC | PRN
  Administered 2020-02-03: 100 ug via INTRAVENOUS
  Filled 2020-02-02: qty 2

## 2020-02-02 MED ORDER — OXYTOCIN BOLUS FROM INFUSION
333.0000 mL | Freq: Once | INTRAVENOUS | Status: AC
  Administered 2020-02-03: 333 mL via INTRAVENOUS

## 2020-02-02 MED ORDER — LACTATED RINGERS IV SOLN: 500.0000 mL | INTRAVENOUS | Status: DC | PRN

## 2020-02-02 MED ORDER — LACTATED RINGERS IV SOLN: INTRAVENOUS | Status: DC

## 2020-02-02 MED ORDER — ONDANSETRON HCL 4 MG/2ML IJ SOLN: 4.0000 mg | Freq: Four times a day (QID) | INTRAMUSCULAR | Status: DC | PRN

## 2020-02-02 MED ORDER — CEFAZOLIN SODIUM-DEXTROSE 1-4 GM/50ML-% IV SOLN
1.0000 g | Freq: Three times a day (TID) | INTRAVENOUS | Status: DC
  Administered 2020-02-03: 1 g via INTRAVENOUS
  Filled 2020-02-02 (×3): qty 50

## 2020-02-02 MED ORDER — CEFAZOLIN SODIUM-DEXTROSE 2-4 GM/100ML-% IV SOLN
2.0000 g | Freq: Once | INTRAVENOUS | Status: AC
  Administered 2020-02-02: 2 g via INTRAVENOUS
  Filled 2020-02-02: qty 100

## 2020-02-02 MED ORDER — TERBUTALINE SULFATE 1 MG/ML IJ SOLN: 0.2500 mg | Freq: Once | INTRAMUSCULAR | Status: DC | PRN

## 2020-02-02 NOTE — Progress Notes (Signed)
FB out, SVE 4/80/-2. Bishop score 8, will start pitocin as cervix is favorable.  FHR Cat I  Marlowe Alt, DO OB Fellow, Faculty [facility] 02/02/2020 11:51 PM

## 2020-02-02 NOTE — H&P (Signed)
OBSTETRIC ADMISSION HISTORY AND PHYSICAL  Rebekah Sanders is a 19 y.o. female G1P0 with IUP at [redacted]w[redacted]d presenting for early labor. She reports +FMs. No LOF, VB, blurry vision, headaches, peripheral edema, or RUQ pain. She plans on breastfeeding. She declines birth control.  Dating: By LMP --->  Estimated Date of Delivery: 02/06/20  Sono:  Normal per patient   Prenatal History/Complications: Elevated Hgb F PCN allergy  Past Medical History: Past Medical History:  Diagnosis Date  . ADHD (attention deficit hyperactivity disorder)   . Asthma   . Auditory hallucinations   . Bipolar 1 disorder (HCC)   . Chlamydia   . Gonorrhea   . Oppositional defiant disorder   . Trichomonosis     Past Surgical History: Past Surgical History:  Procedure Laterality Date  . TONSILLECTOMY AND ADENOIDECTOMY Bilateral 03/2016    Obstetrical History: OB History    Gravida  1   Para      Term      Preterm      AB      Living        SAB      TAB      Ectopic      Multiple      Live Births              Social History: Social History   Socioeconomic History  . Marital status: Single    Spouse name: Not on file  . Number of children: 0  . Years of education: Not on file  . Highest education level: Not on file  Occupational History  . Not on file  Tobacco Use  . Smoking status: Former Games developer  . Smokeless tobacco: Never Used  . Tobacco comment: father smokes in home  Vaping Use  . Vaping Use: Never used  Substance and Sexual Activity  . Alcohol use: Not Currently    Comment: approx "a fifth of liquor" weekly on average  . Drug use: Not Currently    Types: Marijuana    Comment: Pt denies  . Sexual activity: Not Currently    Partners: Male    Comment: had protected sex 2 days ago, but the condom fell off. 05/27/16  protected last weekend. 4/11/18unprocted  3 weeks ago   Other Topics Concern  . Not on file  Social History Narrative  . Not on file   Social  Determinants of Health   Financial Resource Strain:   . Difficulty of Paying Living Expenses:   Food Insecurity:   . Worried About Programme researcher, broadcasting/film/video in the Last Year:   . Barista in the Last Year:   Transportation Needs:   . Freight forwarder (Medical):   Marland Kitchen Lack of Transportation (Non-Medical):   Physical Activity:   . Days of Exercise per Week:   . Minutes of Exercise per Session:   Stress:   . Feeling of Stress :   Social Connections:   . Frequency of Communication with Friends and Family:   . Frequency of Social Gatherings with Friends and Family:   . Attends Religious Services:   . Active Member of Clubs or Organizations:   . Attends Banker Meetings:   Marland Kitchen Marital Status:     Family History: Family History  Problem Relation Age of Onset  . Bipolar disorder Other        Maternal grandparents  . Depression Other        Parents  . Drug abuse Other  multiple family members by report  . Diabetes Mother     Allergies: Allergies  Allergen Reactions  . Penicillins Hives    Medications Prior to Admission  Medication Sig Dispense Refill Last Dose  . cyclobenzaprine (FLEXERIL) 10 MG tablet Take 1 tablet (10 mg total) by mouth 2 (two) times daily as needed for muscle spasms. 20 tablet 0   . Prenatal Vit-Fe Fumarate-FA (PRENATAL COMPLETE) 14-0.4 MG TABS Take 1 tablet by mouth daily. 60 tablet 0      Review of Systems:  All systems reviewed and negative except as stated in HPI  PE: Blood pressure 139/83, pulse 98, temperature 98.1 F (36.7 C), temperature source Oral, resp. rate 18, height 5\' 7"  (1.702 m), weight 118.8 kg, last menstrual period 05/02/2019, SpO2 97 %. General appearance: alert and cooperative Lungs: regular rate and effort Heart: regular rate  Abdomen: soft, non-tender, Fundal Height 41 cm Extremities: Homans sign is negative, no sign of DVT Presentation: cephalic EFM: 140 bpm, moderate variability, 15x15 accels, 2  late decels in MAU Toco: irregular contractions q 2-6 minutes Dilation: 1.5 Effacement (%): 70 Station: -2 Exam by:: [email or web address], rnc  Prenatal labs: ABO, Rh: --/--/A POS Performed at [facility], [address], [place], [place]  [phone]) Antibody: NEG (07/10 1730) Rubella:   RPR: NON REACTIVE (11/10 0244)  HBsAg:    HIV: NON REACTIVE (11/10 0244)  GBS:   positive in urine 2 hr GTT unknown  Prenatal Transfer Tool  Maternal Diabetes: unknown Genetic Screening: unknown Maternal Ultrasounds/Referrals: Normal Fetal Ultrasounds or other Referrals:  None Maternal Substance Abuse:  No Significant Maternal Medications:  None Significant Maternal Lab Results: Group B Strep positive  Results for orders placed or performed during the hospital encounter of 02/02/20 (from the past 24 hour(s))  Type and screen [facility]   Collection Time: 02/02/20  5:30 PM  Result Value Ref Range   ABO/RH(D) A POS    Antibody Screen NEG    Sample Expiration      02/05/2020,2359 Performed at [facility], [address] [place], [place]   CBC   Collection Time: 02/02/20  5:33 PM  Result Value Ref Range   WBC 10.6 (H) 4.0 - 10.5 K/uL   RBC 3.84 (L) 3.87 - 5.11 MIL/uL   Hemoglobin 10.7 (L) 12.0 - 15.0 g/dL   HCT 04/04/20 (L) 36 - 46 %   MCV 88.0 80.0 - 100.0 fL   MCH 27.9 26.0 - 34.0 pg   MCHC 31.7 30.0 - 36.0 g/dL   RDW 03.4 (H) 74.2 - 59.5 %   Platelets 339 150 - 400 K/uL   nRBC 0.0 0.0 - 0.2 %  Amnisure rupture of membrane (rom)not at [facility]   Collection Time: 02/02/20  5:50 PM  Result Value Ref Range   Amnisure ROM NEGATIVE   Wet prep, genital   Collection Time: 02/02/20  6:00 PM  Result Value Ref Range   Yeast Wet Prep HPF POC NONE SEEN NONE SEEN   Trich, Wet Prep NONE SEEN NONE SEEN   Clue Cells Wet Prep HPF POC NONE SEEN NONE SEEN   WBC, Wet Prep HPF POC FEW (A) NONE SEEN   Sperm NONE SEEN   SARS Coronavirus 2 by RT PCR  (hospital order, performed in [facility] hospital lab) Nasopharyngeal Nasopharyngeal Swab   Collection Time: 02/02/20  8:40 PM   Specimen: Nasopharyngeal Swab  Result Value Ref Range  SARS Coronavirus 2 NEGATIVE NEGATIVE  ABO/Rh   Collection Time: 02/02/20  9:13 PM  Result Value Ref Range   ABO/RH(D)      A POS Performed at [facility], [address], [place], [place] [address]     Patient Active Problem List   Diagnosis Date Noted  . Normal labor 02/02/2020  . Chlamydia infection affecting pregnancy 01/24/2020  . Fall due to ice or snow 07/23/2019  . Neck pain without injury 07/23/2019  . Trichomoniasis 07/12/2019  . Low ferritin 03/03/2018  . Acne vulgaris 03/03/2018  . History of trichomoniasis 03/03/2018  . History of gonorrhea 03/03/2018  . History of chlamydia 03/03/2018  . OSA (obstructive sleep apnea) 05/26/2016  . Screening for STD (sexually transmitted disease) 03/13/2013  . Severe bipolar I disorder, current or most recent episode mixed (HCC) 08/08/2012  . ODD (oppositional defiant disorder) 08/08/2012  . ADHD (attention deficit hyperactivity disorder), combined type 08/08/2012    Assessment: Rebekah Sanders is a 19 y.o. G1P0 at [redacted]w[redacted]d here for early labor, 2 late decels in MAU  1. Labor: FB placed without difficulty. Contracting right now, so will hold off cytotec or pitocin. Reassess when FB out 2. FWB: Cat I over all, no decels since admission from MAU 3. Pain: per patient request 4. GBS: positive in urine. PCN allergy, but can take cephalosporins- Ancef   Plan: Admit to L&D.  Risks and benefits of induction were reviewed, including failure of method, prolonged labor, need for further intervention, risk of cesarean.  Patient and family seem to understand these risks and wish to proceed. Options of cytotec, foley bulb, AROM, and pitocin reviewed, with use of each discussed.  Arryana Tolleson L Henny Strauch, DO  02/02/2020, 10:21 PM

## 2020-02-02 NOTE — MAU Provider Note (Signed)
First Provider Initiated Contact with Patient 02/02/20 1657       S: [name] is a 19 y.o. G1P0 at [redacted]w[redacted]d  who presents to MAU today complaining of leaking of fluid since 2pm today. Patient reports she was squatting to try to induce labor and she felt a gush of clear, watery, odorless fluid that soaked a pad. Patient reports this occurred once and has not happened since. She denies vaginal bleeding. She endorses contractions. She reports normal fetal movement.  Patient reports she was dropped by her OB around 28 weeks after she lost her insurance. Patient reports she was not being monitored for any problems in her pregnancy and her anatomy US was normal. Patient has not been able to find an OB to accept her to their practice since 28 weeks and has not been seen since that time.  O: BP 121/82   Pulse 100   Temp 98.3 F (36.8 C) (Oral)   Resp 17   Ht 5\' 7"  (1.702 m)   Wt 118.8 kg   LMP 05/02/2019   SpO2 97%   BMI 41.04 kg/m    Patient Vitals for the past 24 hrs:  BP Temp Temp src Pulse Resp SpO2 Height Weight  02/02/20 1942 121/82 -- -- 100 17 -- -- --  02/02/20 1803 118/78 -- -- (!) 112 -- 97 % -- --  02/02/20 1619 133/89 98.3 F (36.8 C) Oral (!) 111 18 98 % -- --  02/02/20 1617 -- -- -- -- -- -- 5\' 7"  (1.702 m) 118.8 kg   GENERAL: Well-developed, well-nourished female in no acute distress.  HEAD: Normocephalic, atraumatic.  CHEST: Normal effort of breathing, regular heart rate ABDOMEN: Soft, nontender, gravid  Cervical exam by RN: Dilation: 1.5 Effacement (%): 70 Cervical Position: Middle Station: -2 Presentation: Vertex Exam by:: , rnc   Fetal Monitoring: reactive Baseline: 135 Variability: moderate Accelerations: present, 15x15 Decelerations: single early Contractions: q5-25min  Results for orders placed or performed during the hospital encounter of 02/02/20 (from the past 24 hour(s))  CBC     Status: Abnormal   Collection Time:  02/02/20  5:33 PM  Result Value Ref Range   WBC 10.6 (H) 4.0 - 10.5 K/uL   RBC 3.84 (L) 3.87 - 5.11 MIL/uL   Hemoglobin 10.7 (L) 12.0 - 15.0 g/dL   HCT 04/04/20 (L) 36 - 46 %   MCV 88.0 80.0 - 100.0 fL   MCH 27.9 26.0 - 34.0 pg   MCHC 31.7 30.0 - 36.0 g/dL   RDW 04/04/20 (H) 93.2 - 35.5 %   Platelets 339 150 - 400 K/uL   nRBC 0.0 0.0 - 0.2 %  Amnisure rupture of membrane (rom)not at [facility]     Status: None   Collection Time: 02/02/20  5:50 PM  Result Value Ref Range   Amnisure ROM NEGATIVE   Wet prep, genital     Status: Abnormal   Collection Time: 02/02/20  6:00 PM  Result Value Ref Range   Yeast Wet Prep HPF POC NONE SEEN NONE SEEN   Trich, Wet Prep NONE SEEN NONE SEEN   Clue Cells Wet Prep HPF POC NONE SEEN NONE SEEN   WBC, Wet Prep HPF POC FEW (A) NONE SEEN   Sperm NONE SEEN     A: SIUP at [redacted]w[redacted]d CBC: H/H10.7/33.8 WetPrep: WNL GC/CT collected AmniSure: negative GBS+ in urine Limited [redacted]w[redacted]d: anterior placenta, cephalic, AFI 19.1cm Cervix changed only from 90%-70% effaced Membranes intact  P: Just before  discharge, pt had two, late decelerations on monitor Consulted with Dr. Mayford Knife, will admit patient for induction Admit to L&D  Dr. Salomon Mast to bedside to discuss  Rebekah Sanders, Rebekah Sera, NP 02/02/2020 8:27 PM

## 2020-02-02 NOTE — MAU Note (Signed)
Rebekah Sanders is a 19 y.o. at [redacted]w[redacted]d here in MAU reporting: contractions that started this morning, states she went on a walk around 12 and then again around 1400, and now her contractions are back to back. No bleeding, unsure about LOF. Had 1 episode of LOF when she was doing squats. +FM  Onset of complaint: today  Pain score: 9/10  Vitals:   02/02/20 1619  BP: 133/89  Pulse: (!) 111  Resp: 18  Temp: 98.3 F (36.8 C)  SpO2: 98%     FHT: +FM  Lab orders placed from triage: none

## 2020-02-03 ENCOUNTER — Encounter (HOSPITAL_COMMUNITY): Payer: Self-pay | Admitting: Obstetrics & Gynecology

## 2020-02-03 DIAGNOSIS — Z3A39 39 weeks gestation of pregnancy: Secondary | ICD-10-CM

## 2020-02-03 DIAGNOSIS — Z3480 Encounter for supervision of other normal pregnancy, unspecified trimester: Secondary | ICD-10-CM

## 2020-02-03 DIAGNOSIS — O99824 Streptococcus B carrier state complicating childbirth: Secondary | ICD-10-CM

## 2020-02-03 LAB — RPR: RPR Ser Ql: NONREACTIVE

## 2020-02-03 MED ORDER — COCONUT OIL OIL: 1.0000 "application " | TOPICAL_OIL | Status: DC | PRN

## 2020-02-03 MED ORDER — WITCH HAZEL-GLYCERIN EX PADS: 1.0000 "application " | MEDICATED_PAD | CUTANEOUS | Status: DC | PRN

## 2020-02-03 MED ORDER — DIPHENHYDRAMINE HCL 25 MG PO CAPS: 25.0000 mg | ORAL_CAPSULE | Freq: Four times a day (QID) | ORAL | Status: DC | PRN

## 2020-02-03 MED ORDER — PRENATAL MULTIVITAMIN CH
1.0000 | ORAL_TABLET | Freq: Every day | ORAL | Status: DC
  Filled 2020-02-03: qty 1

## 2020-02-03 MED ORDER — SENNOSIDES-DOCUSATE SODIUM 8.6-50 MG PO TABS
2.0000 | ORAL_TABLET | ORAL | Status: DC
  Filled 2020-02-03: qty 2

## 2020-02-03 MED ORDER — ONDANSETRON HCL 4 MG PO TABS: 4.0000 mg | ORAL_TABLET | ORAL | Status: DC | PRN

## 2020-02-03 MED ORDER — IBUPROFEN 600 MG PO TABS
600.0000 mg | ORAL_TABLET | Freq: Four times a day (QID) | ORAL | Status: DC
  Administered 2020-02-03 – 2020-02-05 (×3): 600 mg via ORAL
  Filled 2020-02-03 (×6): qty 1

## 2020-02-03 MED ORDER — DIBUCAINE (PERIANAL) 1 % EX OINT: 1.0000 "application " | TOPICAL_OINTMENT | CUTANEOUS | Status: DC | PRN

## 2020-02-03 MED ORDER — SIMETHICONE 80 MG PO CHEW
80.0000 mg | CHEWABLE_TABLET | ORAL | Status: DC | PRN
  Administered 2020-02-04: 80 mg via ORAL
  Filled 2020-02-03: qty 1

## 2020-02-03 MED ORDER — BENZOCAINE-MENTHOL 20-0.5 % EX AERO
1.0000 "application " | INHALATION_SPRAY | CUTANEOUS | Status: DC | PRN
  Administered 2020-02-03: 1 via TOPICAL
  Filled 2020-02-03: qty 56

## 2020-02-03 MED ORDER — TETANUS-DIPHTH-ACELL PERTUSSIS 5-2.5-18.5 LF-MCG/0.5 IM SUSP: 0.5000 mL | Freq: Once | INTRAMUSCULAR | Status: DC

## 2020-02-03 MED ORDER — ACETAMINOPHEN 325 MG PO TABS
650.0000 mg | ORAL_TABLET | ORAL | Status: DC | PRN
  Administered 2020-02-05: 650 mg via ORAL
  Filled 2020-02-03: qty 2

## 2020-02-03 MED ORDER — ONDANSETRON HCL 4 MG/2ML IJ SOLN: 4.0000 mg | INTRAMUSCULAR | Status: DC | PRN

## 2020-02-03 MED ORDER — LIDOCAINE HCL (PF) 1 % IJ SOLN
INTRAMUSCULAR | Status: AC
  Filled 2020-02-03: qty 30

## 2020-02-03 MED ORDER — ZOLPIDEM TARTRATE 5 MG PO TABS: 5.0000 mg | ORAL_TABLET | Freq: Every evening | ORAL | Status: DC | PRN

## 2020-02-03 NOTE — Progress Notes (Signed)
Latarshia Jersey is a 19 y.o. G1P0 at [redacted]w[redacted]d admitted for early labor  Subjective: Feeling contractions, FB out per RN  Objective: BP (!) 112/55   Pulse 96   Temp 98.1 F (36.7 C) (Oral)   Resp 16   Ht 5\' 7"  (1.702 m)   Wt 118.8 kg   LMP 05/02/2019   SpO2 97%   BMI 41.04 kg/m  No intake/output data recorded.  FHT:  FHR: 130 bpm, variability: moderate,  accelerations:  Present,  decelerations:  Absent UC:   regular, every 5-6 minutes  SVE:   Dilation: 4 Effacement (%): 80 Station: -2 Exam by:: [email or web address], RN  Pitocin @ 6 mu/min  Labs: Lab Results  Component Value Date   WBC 10.6 (H) 02/02/2020   HGB 10.7 (L) 02/02/2020   HCT 33.8 (L) 02/02/2020   MCV 88.0 02/02/2020   PLT 339 02/02/2020    Assessment / Plan: Cyrah Mclamb is a 19 y.o. G1P0 at [redacted]w[redacted]d here for early labor, 2 late decels in MAU  1. Labor: S/p FB. Pitocin started at 0057, now at 6. Consider AROM when appropriate. 2. FWB: Cat I, no decels since admission from MAU 3. Pain: per patient request 4. GBS: positive in urine. PCN allergy, but can take cephalosporins- Ancef  [redacted]w[redacted]d Trelyn Vanderlinde DO OB Fellow, Faculty Practice 02/03/2020, 2:38 AM

## 2020-02-03 NOTE — Progress Notes (Signed)
Takeria Marquina is a 19 y.o. G1P0 at [redacted]w[redacted]d admitted for early labor, 2 late decels in MAU  Subjective: Contractions are starting to feel more painful.  Objective: BP 118/72   Pulse 82   Temp 97.8 F (36.6 C) (Oral)   Resp 18   Ht 5\' 7"  (1.702 m)   Wt 118.8 kg   LMP 05/02/2019   SpO2 97%   BMI 41.04 kg/m  No intake/output data recorded.  FHT:  FHR: 120 bpm, variability: moderate,  accelerations:  Present,  decelerations:  Absent UC:   regular, every 3-4 minutes  SVE:   Dilation: 5.5 Effacement (%): 80 Station: -1 Exam by:: [email or web address], RN  Pitocin @ 14 mu/min  Labs: Lab Results  Component Value Date   WBC 10.6 (H) 02/02/2020   HGB 10.7 (L) 02/02/2020   HCT 33.8 (L) 02/02/2020   MCV 88.0 02/02/2020   PLT 339 02/02/2020    Assessment / Plan: Oma Summer-Gravesis a 19 y.o.G1P0 at [redacted]w[redacted]d here for early labor, 2 late decels in MAU  1. Labor:S/p FB. Pitocin started at 0057, now at 14. Progressing, starting to hurt more. Consider AROM when appropriate. 2. FWB:Cat I, no decels since admission from MAU 3. Pain:per patient request 4. [redacted]w[redacted]d in urine. PCN allergy, but can take cephalosporins- Ancef  TAE:[id number] Kami Kube DO OB Fellow, Faculty Practice 02/03/2020, 6:54 AM

## 2020-02-03 NOTE — Discharge Summary (Signed)
Postpartum Discharge Summary      Patient Name: Rebekah Sanders DOB: [birth year] MRN: [id number]  Date of admission: 02/02/2020 Delivery date:02/03/2020  Delivering provider: Marylene Land  Date of discharge: 02/05/2020  Admitting diagnosis: Normal labor [O80, Z37.9] Intrauterine pregnancy: [redacted]w[redacted]d     Secondary diagnosis:  Active Problems:   Normal labor   Supervision of other normal pregnancy, antepartum  Additional problems: No prenatal care since 28 weeks    Discharge diagnosis: Term Pregnancy Delivered                                              Post partum procedures:None Augmentation: AROM and Pitocin Complications: None  Hospital course: Induction of Labor With Vaginal Delivery   19 y.o. yo G1P0 at [redacted]w[redacted]d was admitted to the hospital 02/02/2020 for induction of labor.  Indication for induction: IOL after 2 late decels in MAU.  Patient had an uncomplicated labor course as follows: Admitted after 2 late decels in MAU; AROM and pitocin. Patient progress rapidly from 5 cm to complete and delivered with RN, CNM and SNM called to room to cut and clamp cord. Bilateral labial tears repaired under local anesthesia.  Membrane Rupture Time/Date: 9:43 AM ,02/03/2020   Delivery Method:Vaginal, Spontaneous  Episiotomy: None  Lacerations:  Labial  Details of delivery can be found in separate delivery note.  Patient had a routine postpartum course. Patient is discharged home 02/05/20.  Newborn Data: Birth date:02/03/2020  Birth time:12:40 PM  Gender:Female  Living status:Living  Apgars:8 ,9  Weight:3570 g   Magnesium Sulfate received: No BMZ received: No Rhophylac:No MMR:No T-DaP:Given prenatally Flu: No Transfusion:No  Physical exam  Vitals:   02/04/20 0415 02/04/20 1542 02/04/20 2055 02/05/20 0538  BP: (!) 110/57 132/89 (!) 132/95 124/72  Pulse: 92 89 100 80  Resp: 14 16 16 18   Temp: 98 F (36.7 C) 98.1 F (36.7 C) 98.2 F (36.8 C) 97.9 F (36.6 C)   TempSrc: Oral Oral Oral Oral  SpO2: 100%  99% 100%  Weight:      Height:       General: alert, cooperative and no distress Lochia: appropriate Uterine Fundus: firm Incision: N/A DVT Evaluation: No evidence of DVT seen on physical exam. Labs: Lab Results  Component Value Date   WBC 12.6 (H) 02/04/2020   HGB 9.9 (L) 02/04/2020   HCT 31.2 (L) 02/04/2020   MCV 89.1 02/04/2020   PLT 287 02/04/2020   CMP Latest Ref Rng & Units 06/04/2019  Glucose 70 - 99 mg/dL 98  BUN 6 - 20 mg/dL 8  Creatinine 7.56 - 4.33 mg/dL 2.95  Sodium 188 - 416 mmol/L 134(L)  Potassium 3.5 - 5.1 mmol/L 3.4(L)  Chloride 98 - 111 mmol/L 105  CO2 22 - 32 mmol/L 20(L)  Calcium 8.9 - 10.3 mg/dL 6.0(Y)  Total Protein 6.5 - 8.1 g/dL 7.5  Total Bilirubin 0.3 - 1.2 mg/dL 0.7  Alkaline Phos 38 - 126 U/L 43  AST 15 - 41 U/L 18  ALT 0 - 44 U/L 12   Edinburgh Score: Edinburgh Postnatal Depression Scale Screening Tool 02/03/2020  I have been able to laugh and see the funny side of things. 2  I have looked forward with enjoyment to things. 2  I have blamed myself unnecessarily when things went wrong. 1  I have been anxious or worried for  no good reason. 2  I have felt scared or panicky for no good reason. 0  Things have been getting on top of me. 0  I have been so unhappy that I have had difficulty sleeping. 0  I have felt sad or miserable. 0  I have been so unhappy that I have been crying. 0  The thought of harming myself has occurred to me. 0  Edinburgh Postnatal Depression Scale Total 7     After visit meds:  Allergies as of 02/05/2020      Reactions   Penicillins Hives      Medication List    STOP taking these medications   cyclobenzaprine 10 MG tablet Commonly known as: FLEXERIL     TAKE these medications   acetaminophen 500 MG tablet Commonly known as: TYLENOL Take 1,000 mg by mouth every 6 (six) hours as needed for mild pain or headache.   ibuprofen 600 MG tablet Commonly known as:  ADVIL Take 1 tablet (600 mg total) by mouth every 6 (six) hours.   Prenatal Complete 14-0.4 MG Tabs Take 1 tablet by mouth daily.        Discharge home in stable condition Infant Feeding: Breast Infant Disposition:home with mother Discharge instruction: per After Visit Summary and Postpartum booklet. Activity: Advance as tolerated. Pelvic rest for 6 weeks.  Diet: routine diet Future Appointments:No future appointments. Follow up Visit:  Follow-up Information    Department, [facility]. Schedule an appointment as soon as possible for a visit in 6 week(s).   Why: Call this week for a post partum visit to be scheduled Contact information: [address] [place] [address] [phone]                Please schedule this patient for a In person postpartum visit in 4 weeks with the following provider: Any provider at [facility] Additional Postpartum F/U:none  Low risk pregnancy complicated by: No prenatal care after 28 weeks Delivery mode:  Vaginal, Spontaneous  Anticipated Birth Control:  Unsure   02/05/2020 Sumner Kirchman L Keishawna Carranza, DO

## 2020-02-04 LAB — CBC
HCT: 31.2 % — ABNORMAL LOW (ref 36.0–46.0)
Hemoglobin: 9.9 g/dL — ABNORMAL LOW (ref 12.0–15.0)
MCH: 28.3 pg (ref 26.0–34.0)
MCHC: 31.7 g/dL (ref 30.0–36.0)
MCV: 89.1 fL (ref 80.0–100.0)
Platelets: 287 10*3/uL (ref 150–400)
RBC: 3.5 MIL/uL — ABNORMAL LOW (ref 3.87–5.11)
RDW: 16.5 % — ABNORMAL HIGH (ref 11.5–15.5)
WBC: 12.6 10*3/uL — ABNORMAL HIGH (ref 4.0–10.5)
nRBC: 0 % (ref 0.0–0.2)

## 2020-02-04 LAB — GC/CHLAMYDIA PROBE AMP (~~LOC~~) NOT AT ARMC
Chlamydia: NEGATIVE
Comment: NEGATIVE
Comment: NORMAL
Neisseria Gonorrhea: NEGATIVE

## 2020-02-04 NOTE — Plan of Care (Signed)

## 2020-02-04 NOTE — Clinical Social Work Maternal (Signed)
CLINICAL SOCIAL WORK MATERNAL/CHILD NOTE  Patient Details  Name: Rebekah Sanders MRN: [id number] Date of Birth: 17-Feb-2022  Date:  2019/08/15  Clinical Social Worker Initiating Note:  Elijio Miles Date/Time: Initiated:  02/04/20/1128     Child's Name:  Rebekah Sanders   Biological Parents:  Mother, Father Estephan Gallardo DOB: 05/16/1988)   Need for Interpreter:  None   Reason for Referral:  Current Substance Use/Substance Use During Pregnancy  , Behavioral Health Concerns   Address:  [address] [place], [place]    Phone number:  [phone] (home)     Additional phone number:   Household Members/Support Persons (HM/SP):   Household Member/Support Person 1   HM/SP Name Relationship DOB or Age  HM/SP -1 Tavonte Seybold FOB 05/16/1988  HM/SP -2        HM/SP -3        HM/SP -4        HM/SP -5        HM/SP -6        HM/SP -7        HM/SP -8          Natural Supports (not living in the home):  Parent   Professional Supports: None   Employment: Unemployed   Type of Work:     Education:  Programmer, systems   Homebound arranged:    Museum/gallery curator Resources:  Self-Pay     Other Resources:      Cultural/Religious Considerations Which May Impact Care:    Strengths:  Ability to meet basic needs  , Home prepared for child  , Pediatrician chosen   Psychotropic Medications:         Pediatrician:    [place] area  Pediatrician List:   [facility] for [place]      Pediatrician Fax Number:    Risk Factors/Current Problems:  Mental Health Concerns  , Substance Use     Cognitive State:  Able to Concentrate  , Alert  , Linear Thinking     Mood/Affect:  Comfortable  , Interested  , Happy     CSW Assessment:  CSW received consult for history of bipolar disorder and previous history of THC and ETOH use. CSW completed chart review and was unable  to find documentation stating patient used THC or ETOH while pregnant. CSW met with MOB to offer support and complete assessment.    MOB sitting up in bed changing infant's diaper, when CSW entered the room. CSW introduced self and explained reason for consult to which MOB expressed understanding. Per MOB, she and FOB currently live together. CSW inquired about MOB's mental health history to which MOB acknowledged history of inpatient psychiatric treatment when she was 19 years old but denied any mental health symptoms or concerns since. MOB reported having good support from her mother. MOB denied any current SI, HI or DV. CSW provided education regarding the baby blues period vs. perinatal mood disorders, discussed treatment and gave resources for mental health follow up if concerns arise.  CSW recommended self-evaluation during the postpartum time period using the New Mom Checklist from Postpartum Progress and encouraged MOB to contact a medical professional if symptoms are noted at any time.    CSW unable to find documentation indicating MOB used substances during her pregnancy. CSW did, however, address this with MOB and MOB reported last  use of marijuana was 2-3 months before she became pregnant. MOB confirmed having all essential items for infant once discharged and stated infant would be sleeping in a crib once home. CSW provided review of Sudden Infant Death Syndrome (SIDS) precautions and safe sleeping habits.     CSW Plan/Description:  No Further Intervention Required/No Barriers to Discharge, Sudden Infant Death Syndrome (SIDS) Education, Perinatal Mood and Anxiety Disorder (PMADs) Education    Kodie Pick, LCSW 02/04/2020, 12:21 PM  

## 2020-02-04 NOTE — Plan of Care (Signed)
  Problem: Clinical Measurements: Goal: Ability to maintain clinical measurements within normal limits will improve Outcome: Completed/Met Goal: Will remain free from infection Outcome: Completed/Met Goal: Diagnostic test results will improve Outcome: Completed/Met Goal: Respiratory complications will improve Outcome: Completed/Met Goal: Cardiovascular complication will be avoided Outcome: Completed/Met   Problem: Activity: Goal: Risk for activity intolerance will decrease Outcome: Completed/Met   Problem: Pain Managment: Goal: General experience of comfort will improve Outcome: Completed/Met   Problem: Safety: Goal: Ability to remain free from injury will improve Outcome: Completed/Met   Problem: Education: Goal: Knowledge of condition will improve Outcome: Completed/Met   Problem: Activity: Goal: Will verbalize the importance of balancing activity with adequate rest periods Outcome: Completed/Met Goal: Ability to tolerate increased activity will improve Outcome: Completed/Met   Problem: Life Cycle: Goal: Chance of risk for complications during the postpartum period will decrease Outcome: Completed/Met   Problem: Role Relationship: Goal: Ability to demonstrate positive interaction with newborn will improve Outcome: Completed/Met

## 2020-02-04 NOTE — Lactation Note (Signed)
This note was copied from a baby's chart. Lactation Consultation Note Baby 16 hrs old. Mom holding sleeping baby STS. Mom stated baby latched well w/RN assistance. Mom stated she doesn't know what she's doing yet but he fed good. Mom excited about BF. Taught mom hand expression. Mom excited to see colostrum. Mom feeling good about how BF going so far.  Newborn behavior, STS, I&O, breast massage, milk storage, supply and demand discussed. Mom encouraged to feed baby 8-12 times/24 hours and with feeding cues. Mom encouraged to waken baby for feeds if hasn't cued to feed in 3 hrs.  Answered questions mom had. One was drinking and BF. Encouraged mom to call for assistance or further questions. Lactation needs to see a latch today. Asked mom to call out later for feeding.  Lactation brochure given. Mom doesn't have WIC.  Patient Name: Rebekah Sanders Date: 02/04/2020 Reason for consult: Initial assessment;Primapara;Term   Maternal Data Has patient been taught Hand Expression?: Yes Does the patient have breastfeeding experience prior to this delivery?: No  Feeding Feeding Type: Breast Fed  LATCH Score Latch: Grasps breast easily, tongue down, lips flanged, rhythmical sucking.  Audible Swallowing: A few with stimulation  Type of Nipple: Everted at rest and after stimulation  Comfort (Breast/Nipple): Soft / non-tender  Hold (Positioning): Assistance needed to correctly position infant at breast and maintain latch.  LATCH Score: 8  Interventions Interventions: Breast feeding basics reviewed;Support pillows;Skin to skin;Breast massage;Hand express;Breast compression;DEBP;Hand pump;Position options  Lactation Tools Discussed/Used Tools: Pump Breast pump type: Double-Electric Breast Pump;Manual WIC Program: No   Consult Status Consult Status: Follow-up Date: 02/04/20 (in pm) Follow-up type: In-patient    Charyl Dancer 02/04/2020, 5:01 AM

## 2020-02-04 NOTE — Lactation Note (Signed)
This note was copied from a baby's chart. Lactation Consultation Note LC attempted to see mom, mom sleeping.  Patient Name: Rebekah Sanders [id number] Date: 02/04/2020     Maternal Data    Feeding Feeding Type: Breast Fed  LATCH Score Latch: Grasps breast easily, tongue down, lips flanged, rhythmical sucking.  Audible Swallowing: A few with stimulation  Type of Nipple: Everted at rest and after stimulation  Comfort (Breast/Nipple): Soft / non-tender  Hold (Positioning): Assistance needed to correctly position infant at breast and maintain latch.  LATCH Score: 8  Interventions    Lactation Tools Discussed/Used     Consult Status      Rebekah Sanders, Diamond Nickel 02/04/2020, 3:36 AM

## 2020-02-04 NOTE — Progress Notes (Signed)
Post Partum Day 1 S/p SVD 02/03/2020 at 1240  Subjective: no complaints, up ad lib, voiding, tolerating PO and + flatus. Passed a single clot half the size of a lemon a short while ago. Desires discharge today.  Objective: Blood pressure (!) 110/57, pulse 92, temperature 98 F (36.7 C), temperature source Oral, resp. rate 14, height 5\' 7"  (1.702 m), weight 118.8 kg, last menstrual period 05/02/2019, SpO2 100 %, unknown if currently breastfeeding.  Physical Exam:  General: alert, cooperative, appears stated age and no distress Lochia: appropriate Uterine Fundus: firm Incision: N/A DVT Evaluation: No evidence of DVT seen on physical exam.  Recent Labs    02/02/20 1733 02/04/20 0640  HGB 10.7* 9.9*  HCT 33.8* 31.2*    Assessment/Plan: --Clot examined by CNM, not concerning --Eligible for discharge today pending LCSW rounding --Will update and discharge once cleared by LCSW, patient aware, discussed indication for SW consult --04/06/20, RN called. Plan of care confirmed    LOS: 2 days   Rebekah Sanders, CNM 02/04/2020, 9:37 AM

## 2020-02-05 MED ORDER — IBUPROFEN 600 MG PO TABS: 600.0000 mg | ORAL_TABLET | Freq: Four times a day (QID) | ORAL | 0 refills | Status: DC

## 2020-02-05 NOTE — Discharge Instructions (Signed)
Prenatal Care Providers           [facility] - accepts patients without insurance  Phone: [phone]  [facility] @ [facility]   Phone: [phone]  [facility]       Phone: [phone]            [facility] @ [place]     Phone: [phone]          [facility] @ [facility]   Phone: [phone]  [facility] @ [facility] - accepts patients without insurance  Phone: [phone]  [facility] @ [facility] Phone: [phone]     [facility] - accepts patients without insurance Phone: [phone]  [facility]  Phone: [phone]  [phone] OB/GYN Phone: [phone]  [facility] Phone: [phone]  [facility] Phone: [phone]  [facility] Phone: [phone]  [facility]  Phone: [phone]   Postpartum Care After Vaginal Delivery This sheet gives you information about how to care for yourself from the time you deliver your baby to up to 6-12 weeks after delivery (postpartum period). Your health care provider may also give you more specific instructions. If you have problems or questions, contact your health care provider. Follow these instructions at home: Vaginal bleeding  It is normal to have vaginal bleeding (lochia) after delivery. Wear a sanitary pad for vaginal bleeding and discharge. ? During the first week after delivery, the amount and appearance of lochia is often similar to a menstrual period. ? Over the next few weeks, it will gradually decrease to a dry, yellow-brown discharge. ? For most women, lochia stops completely by 4-6 weeks after delivery. Vaginal bleeding can vary from woman to woman.  Change your sanitary pads frequently. Watch for any changes in your flow, such as: ? A sudden increase in  volume. ? A change in color. ? Large blood clots.  If you pass a blood clot from your vagina, save it and call your health care provider to discuss. Do not flush blood clots down the toilet before talking with your health care provider.  Do not use tampons or douches until your health care provider says this is safe.  If you are not breastfeeding, your period should return 6-8 weeks after delivery. If you are feeding your child breast milk only (exclusive breastfeeding), your period may not return until you stop breastfeeding. Perineal care  Keep the area between the vagina and the anus (perineum) clean and dry as told by your health care provider. Use medicated pads and pain-relieving sprays and creams as directed.  If you had a cut in the perineum (episiotomy) or a tear in the vagina, check the area for signs of infection until you are healed. Check for: ? More redness, swelling, or pain. ? Fluid or blood coming from the cut or tear. ? Warmth. ? Pus or a bad smell.  You may be given a squirt bottle to use instead of wiping to clean the perineum area after you go to the bathroom. As you start healing, you may use the squirt bottle before wiping yourself. Make sure to wipe gently.  To relieve pain caused by an episiotomy, a tear in the vagina, or swollen veins in the anus (hemorrhoids), try taking a warm sitz bath 2-3 times a day. A sitz bath is a warm water bath that is taken  while you are sitting down. The water should only come up to your hips and should cover your buttocks. Breast care  Within the first few days after delivery, your breasts may feel heavy, full, and uncomfortable (breast engorgement). Milk may also leak from your breasts. Your health care provider can suggest ways to help relieve the discomfort. Breast engorgement should go away within a few days.  If you are breastfeeding: ? Wear a bra that supports your breasts and fits you well. ? Keep your nipples clean and dry.  Apply creams and ointments as told by your health care provider. ? You may need to use breast pads to absorb milk that leaks from your breasts. ? You may have uterine contractions every time you breastfeed for up to several weeks after delivery. Uterine contractions help your uterus return to its normal size. ? If you have any problems with breastfeeding, work with your health care provider or Advertising copywriter.  If you are not breastfeeding: ? Avoid touching your breasts a lot. Doing this can make your breasts produce more milk. ? Wear a good-fitting bra and use cold packs to help with swelling. ? Do not squeeze out (express) milk. This causes you to make more milk. Intimacy and sexuality  Ask your health care provider when you can engage in sexual activity. This may depend on: ? Your risk of infection. ? How fast you are healing. ? Your comfort and desire to engage in sexual activity.  You are able to get pregnant after delivery, even if you have not had your period. If desired, talk with your health care provider about methods of birth control (contraception). Medicines  Take over-the-counter and prescription medicines only as told by your health care provider.  If you were prescribed an antibiotic medicine, take it as told by your health care provider. Do not stop taking the antibiotic even if you start to feel better. Activity  Gradually return to your normal activities as told by your health care provider. Ask your health care provider what activities are safe for you.  Rest as much as possible. Try to rest or take a nap while your baby is sleeping. Eating and drinking   Drink enough fluid to keep your urine pale yellow.  Eat high-fiber foods every day. These may help prevent or relieve constipation. High-fiber foods include: ? Whole grain cereals and breads. ? Brown rice. ? Beans. ? Fresh fruits and vegetables.  Do not try to lose weight quickly by cutting back on  calories.  Take your prenatal vitamins until your postpartum checkup or until your health care provider tells you it is okay to stop. Lifestyle  Do not use any products that contain nicotine or tobacco, such as cigarettes and e-cigarettes. If you need help quitting, ask your health care provider.  Do not drink alcohol, especially if you are breastfeeding. General instructions  Keep all follow-up visits for you and your baby as told by your health care provider. Most women visit their health care provider for a postpartum checkup within the first 3-6 weeks after delivery. Contact a health care provider if:  You feel unable to cope with the changes that your child brings to your life, and these feelings do not go away.  You feel unusually sad or worried.  Your breasts become red, painful, or hard.  You have a fever.  You have trouble holding urine or keeping urine from leaking.  You have little or no interest in activities you used  to enjoy.  You have not breastfed at all and you have not had a menstrual period for 12 weeks after delivery.  You have stopped breastfeeding and you have not had a menstrual period for 12 weeks after you stopped breastfeeding.  You have questions about caring for yourself or your baby.  You pass a blood clot from your vagina. Get help right away if:  You have chest pain.  You have difficulty breathing.  You have sudden, severe leg pain.  You have severe pain or cramping in your lower abdomen.  You bleed from your vagina so much that you fill more than one sanitary pad in one hour. Bleeding should not be heavier than your heaviest period.  You develop a severe headache.  You faint.  You have blurred vision or spots in your vision.  You have bad-smelling vaginal discharge.  You have thoughts about hurting yourself or your baby. If you ever feel like you may hurt yourself or others, or have thoughts about taking your own life, get help  right away. You can go to the nearest emergency department or call:  Your local emergency services (911 in the [place]  A suicide crisis helpline, such as the National Suicide Prevention Lifeline at 458-109-3512. This is open 24 hours a day. Summary  The period of time right after you deliver your newborn up to 6-12 weeks after delivery is called the postpartum period.  Gradually return to your normal activities as told by your health care provider.  Keep all follow-up visits for you and your baby as told by your health care provider. This information is not intended to replace advice given to you by your health care provider. Make sure you discuss any questions you have with your health care provider. Document Revised: 07/15/2017 Document Reviewed: 04/25/2017 Elsevier Patient Education  2020 [employer].

## 2020-02-05 NOTE — Lactation Note (Signed)
This note was copied from a baby's chart. Lactation Consultation Note  Patient Name: Rebekah Sanders [id number] Date: 02/05/2020 Reason for consult: Follow-up assessment   P1, infant 44 hours old , 5 % weight loss.  LC arrived in the room and mother walking infant attempting to latch infant while standing up.  Suggested sitting with back support. Mother sat on the sofa. Mother latched infant on in cradle hold. Infant tugged with a shallow latch on and off. Mother reports that infant fed well an hour ago and that she is just soothing infant now.  She reports that infant has had several good feedings. Mother reports that she has a good milk supply. Mother had expressed to show LC her milk flow. Observed large drops of transitional milk flowing.   Mother reports that infant had a large green dirty diaper early this am.  Discussed looking for yellow mustard dirty diapers next to indicate mature milk transfer.  Advised mother to do frequent STS and allow infant to cluster feed.  Encouraged mother to keep accurate account of all wet and dirty diapers.   Discussed treatment and prevention of engorgement.  Mother reports that she is aware of all this information. She reports that staff tells her each time they come in the room.  Mother has a hand pump and is aware of how to use.  Mother to continue to cue base feed infant and feed at least 8-12 times or more in 24 hours and advised to allow for cluster feeding infant as needed.   Mother to continue to due STS. Mother is aware of available LC services at [facility], BFSG'S, OP Dept, and phone # for questions or concerns about breastfeeding.  Mother receptive to all teaching and plan of care.     Maternal Data    Feeding Feeding Type: Breast Fed  LATCH Score Latch: Repeated attempts needed to sustain latch, nipple held in mouth throughout feeding, stimulation needed to elicit sucking reflex.  Audible Swallowing: A few with stimulation  Type  of Nipple: Everted at rest and after stimulation  Comfort (Breast/Nipple): Soft / non-tender  Hold (Positioning): No assistance needed to correctly position infant at breast.  LATCH Score: 8  Interventions    Lactation Tools Discussed/Used     Consult Status      Michel Bickers 02/05/2020, 9:09 AM

## 2020-03-03 ENCOUNTER — Encounter: Payer: Self-pay | Admitting: *Deleted

## 2020-03-06 ENCOUNTER — Ambulatory Visit: Payer: Self-pay

## 2020-03-06 ENCOUNTER — Telehealth: Payer: Self-pay

## 2020-03-06 NOTE — Telephone Encounter (Signed)
Attempted to contact patient to get her rescheduled for her missed postpartum visit. No answer, left voicemail for patient to call the office back to be rescheduled.

## 2020-07-04 ENCOUNTER — Inpatient Hospital Stay (HOSPITAL_COMMUNITY): Payer: Medicaid Other

## 2020-07-04 ENCOUNTER — Encounter (HOSPITAL_COMMUNITY): Payer: Self-pay | Admitting: Obstetrics and Gynecology

## 2020-07-04 ENCOUNTER — Other Ambulatory Visit: Payer: Self-pay

## 2020-07-04 ENCOUNTER — Inpatient Hospital Stay (HOSPITAL_COMMUNITY)
Admission: AD | Admit: 2020-07-04 | Discharge: 2020-07-04 | Disposition: A | Discharge status: Home / Self Care | Payer: Medicaid Other | Attending: Obstetrics and Gynecology | Admitting: Obstetrics and Gynecology

## 2020-07-04 DIAGNOSIS — N939 Abnormal uterine and vaginal bleeding, unspecified: Secondary | ICD-10-CM | POA: Diagnosis not present

## 2020-07-04 DIAGNOSIS — O468X1 Other antepartum hemorrhage, first trimester: Secondary | ICD-10-CM | POA: Diagnosis not present

## 2020-07-04 DIAGNOSIS — O26851 Spotting complicating pregnancy, first trimester: Secondary | ICD-10-CM

## 2020-07-04 DIAGNOSIS — Z3A08 8 weeks gestation of pregnancy: Secondary | ICD-10-CM | POA: Insufficient documentation

## 2020-07-04 DIAGNOSIS — Z87891 Personal history of nicotine dependence: Secondary | ICD-10-CM | POA: Insufficient documentation

## 2020-07-04 DIAGNOSIS — Z3A01 Less than 8 weeks gestation of pregnancy: Secondary | ICD-10-CM | POA: Diagnosis not present

## 2020-07-04 DIAGNOSIS — O208 Other hemorrhage in early pregnancy: Secondary | ICD-10-CM | POA: Diagnosis not present

## 2020-07-04 DIAGNOSIS — Z3490 Encounter for supervision of normal pregnancy, unspecified, unspecified trimester: Secondary | ICD-10-CM | POA: Diagnosis present

## 2020-07-04 LAB — URINALYSIS, ROUTINE W REFLEX MICROSCOPIC
Bilirubin Urine: NEGATIVE
Glucose, UA: NEGATIVE mg/dL
Hgb urine dipstick: NEGATIVE
Ketones, ur: NEGATIVE mg/dL
Leukocytes,Ua: NEGATIVE
Nitrite: NEGATIVE
Protein, ur: NEGATIVE mg/dL
Specific Gravity, Urine: 1.016 (ref 1.005–1.030)
pH: 6 (ref 5.0–8.0)

## 2020-07-04 LAB — COMPREHENSIVE METABOLIC PANEL
ALT: 20 U/L (ref 0–44)
AST: 17 U/L (ref 15–41)
Albumin: 3.8 g/dL (ref 3.5–5.0)
Alkaline Phosphatase: 52 U/L (ref 38–126)
Anion gap: 11 (ref 5–15)
BUN: 9 mg/dL (ref 6–20)
CO2: 18 mmol/L — ABNORMAL LOW (ref 22–32)
Calcium: 8.8 mg/dL — ABNORMAL LOW (ref 8.9–10.3)
Chloride: 106 mmol/L (ref 98–111)
Creatinine, Ser: 0.7 mg/dL (ref 0.44–1.00)
GFR, Estimated: >60 mL/min (ref >60)
Glucose, Bld: 94 mg/dL (ref 70–99)
Potassium: 3.6 mmol/L (ref 3.5–5.1)
Sodium: 135 mmol/L (ref 135–145)
Total Bilirubin: 0.7 mg/dL (ref 0.3–1.2)
Total Protein: 7 g/dL (ref 6.5–8.1)

## 2020-07-04 LAB — CBC
HCT: 35.9 % — ABNORMAL LOW (ref 36.0–46.0)
Hemoglobin: 12.7 g/dL (ref 12.0–15.0)
MCH: 32.9 pg (ref 26.0–34.0)
MCHC: 35.4 g/dL (ref 30.0–36.0)
MCV: 93 fL (ref 80.0–100.0)
Platelets: 228 10*3/uL (ref 150–400)
RBC: 3.86 MIL/uL — ABNORMAL LOW (ref 3.87–5.11)
RDW: 13.2 % (ref 11.5–15.5)
WBC: 10.6 10*3/uL — ABNORMAL HIGH (ref 4.0–10.5)
nRBC: 0 % (ref 0.0–0.2)

## 2020-07-04 LAB — HCG, QUANTITATIVE, PREGNANCY: hCG, Beta Chain, Quant, S: 51978 m[IU]/mL — ABNORMAL HIGH (ref <5)

## 2020-07-04 LAB — POCT PREGNANCY, URINE: Preg Test, Ur: POSITIVE — AB

## 2020-07-04 NOTE — MAU Note (Signed)
Presents with c/o cramping & spotting that began today.  States when she had spotting with wiping this morning & saw blood clot x1 @ 1500.

## 2020-07-04 NOTE — Discharge Instructions (Signed)

## 2020-07-04 NOTE — MAU Provider Note (Signed)
History     CSN: [id number]  Arrival date and time: 07/04/20 1712   Event Date/Time   First Provider Initiated Contact with Patient 07/04/20 1742      Chief Complaint  Patient presents with  . Abdominal Pain  . Vaginal Bleeding   HPI  Rebekah Sanders is a 19 y.o. G2P1001 at [redacted]w[redacted]d by LMP who presents to MAU with chief complaint of vaginal spotting. This is a new problem, this morning. Patient reports seeing smears of blood when she wipes after voiding. Patient states she passed two small clots just prior to arriving in MAU. She denies heavy vaginal bleeding. She denies abdominal pain, dysuria, fever. Most recent sexual intercourse two days ago.  Patient's infant son is with her. Her sister is waiting in the car and available to provide child care to allow for evaluation in MAU.  OB History    Gravida  2   Para  1   Term  1   Preterm      AB      Living  1     SAB      IAB      Ectopic      Multiple  0   Live Births  1           Past Medical History:  Diagnosis Date  . ADHD (attention deficit hyperactivity disorder)   . Asthma   . Auditory hallucinations   . Bipolar 1 disorder (HCC)   . Chlamydia   . Gonorrhea   . Oppositional defiant disorder   . Trichomonosis     Past Surgical History:  Procedure Laterality Date  . TONSILLECTOMY AND ADENOIDECTOMY Bilateral 03/2016    Family History  Problem Relation Age of Onset  . Bipolar disorder Other        Maternal grandparents  . Depression Other        Parents  . Drug abuse Other        multiple family members by report  . Diabetes Mother     Social History   Tobacco Use  . Smoking status: Former Games developer  . Smokeless tobacco: Never Used  . Tobacco comment: father smokes in home  Vaping Use  . Vaping Use: Never used  Substance Use Topics  . Alcohol use: Not Currently    Comment: approx "a fifth of liquor" weekly on average  . Drug use: Not Currently    Types: Marijuana    Comment: Pt  denies    Allergies:  Allergies  Allergen Reactions  . Penicillins Hives    Medications Prior to Admission  Medication Sig Dispense Refill Last Dose  . acetaminophen (TYLENOL) 500 MG tablet Take 1,000 mg by mouth every 6 (six) hours as needed for mild pain or headache.     . ibuprofen (ADVIL) 600 MG tablet Take 1 tablet (600 mg total) by mouth every 6 (six) hours. 30 tablet 0   . Prenatal Vit-Fe Fumarate-FA (PRENATAL COMPLETE) 14-0.4 MG TABS Take 1 tablet by mouth daily. (Patient not taking: Reported on 02/04/2020) 60 tablet 0     Review of Systems  Genitourinary: Positive for vaginal bleeding.  All other systems reviewed and are negative.  Physical Exam   Blood pressure 123/77, pulse 89, temperature 98.1 F (36.7 C), resp. rate 20, height 5\' 7"  (1.702 m), weight 97.8 kg, last menstrual period 05/05/2020, SpO2 100 %, unknown if currently breastfeeding.  Physical Exam Vitals and nursing note reviewed. Exam conducted with a chaperone present.  Constitutional:      Appearance: She is not ill-appearing.  Cardiovascular:     Rate and Rhythm: Normal rate.     Heart sounds: Normal heart sounds.  Pulmonary:     Effort: Pulmonary effort is normal.     Breath sounds: Normal breath sounds.  Abdominal:     Palpations: Abdomen is soft.     Tenderness: There is no abdominal tenderness. There is no right CVA tenderness or left CVA tenderness.  Genitourinary:    Comments: Pt declined due to her desire to limit separation from son Skin:    General: Skin is warm and dry.     Capillary Refill: Capillary refill takes less than 2 seconds.  Neurological:     Mental Status: She is alert and oriented to person, place, and time.  Psychiatric:        Mood and Affect: Mood normal.        Behavior: Behavior normal.     MAU Course  Procedures  Orders Placed This Encounter  Procedures  . US OB Comp Less 14 Wks  . Urinalysis, Routine w reflex microscopic Urine, Clean Catch  . CBC  .  Comprehensive metabolic panel  . hCG, quantitative, pregnancy  . Pregnancy, urine POC   Results for orders placed or performed during the hospital encounter of 07/04/20 (from the past 24 hour(s))  Urinalysis, Routine w reflex microscopic Urine, Clean Catch     Status: None   Collection Time: 07/04/20  5:39 PM  Result Value Ref Range   Color, Urine YELLOW YELLOW   APPearance CLEAR CLEAR   Specific Gravity, Urine 1.016 1.005 - 1.030   pH 6.0 5.0 - 8.0   Glucose, UA NEGATIVE NEGATIVE mg/dL   Hgb urine dipstick NEGATIVE NEGATIVE   Bilirubin Urine NEGATIVE NEGATIVE   Ketones, ur NEGATIVE NEGATIVE mg/dL   Protein, ur NEGATIVE NEGATIVE mg/dL   Nitrite NEGATIVE NEGATIVE   Leukocytes,Ua NEGATIVE NEGATIVE  Pregnancy, urine POC     Status: Abnormal   Collection Time: 07/04/20  5:39 PM  Result Value Ref Range   Preg Test, Ur POSITIVE (A) NEGATIVE  CBC     Status: Abnormal   Collection Time: 07/04/20  6:26 PM  Result Value Ref Range   WBC 10.6 (H) 4.0 - 10.5 K/uL   RBC 3.86 (L) 3.87 - 5.11 MIL/uL   Hemoglobin 12.7 12.0 - 15.0 g/dL   HCT 40.9 (L) 81.1 - 91.4 %   MCV 93.0 80.0 - 100.0 fL   MCH 32.9 26.0 - 34.0 pg   MCHC 35.4 30.0 - 36.0 g/dL   RDW 78.2 95.6 - 21.3 %   Platelets 228 150 - 400 K/uL   nRBC 0.0 0.0 - 0.2 %  Comprehensive metabolic panel     Status: Abnormal   Collection Time: 07/04/20  6:26 PM  Result Value Ref Range   Sodium 135 135 - 145 mmol/L   Potassium 3.6 3.5 - 5.1 mmol/L   Chloride 106 98 - 111 mmol/L   CO2 18 (L) 22 - 32 mmol/L   Glucose, Bld 94 70 - 99 mg/dL   BUN 9 6 - 20 mg/dL   Creatinine, Ser 0.86 0.44 - 1.00 mg/dL   Calcium 8.8 (L) 8.9 - 10.3 mg/dL   Total Protein 7.0 6.5 - 8.1 g/dL   Albumin 3.8 3.5 - 5.0 g/dL   AST 17 15 - 41 U/L   ALT 20 0 - 44 U/L   Alkaline Phosphatase 52 38 - 126  U/L   Total Bilirubin 0.7 0.3 - 1.2 mg/dL   GFR, Estimated >08 >65 mL/min   Anion gap 11 5 - 15  hCG, quantitative, pregnancy     Status: Abnormal   Collection Time:  07/04/20  6:26 PM  Result Value Ref Range   hCG, Beta Chain, Quant, S 51,978 (H) <5 mIU/mL   US OB Comp Less 14 Wks  Result Date: 07/04/2020 CLINICAL DATA:  Vaginal bleeding. EXAM: OBSTETRIC <14 WK [place] AND TRANSVAGINAL OB US TECHNIQUE: Both transabdominal and transvaginal ultrasound examinations were performed for complete evaluation of the gestation as well as the maternal uterus, adnexal regions, and pelvic cul-de-sac. Transvaginal technique was performed to assess early pregnancy. COMPARISON:  February 02, 2020 FINDINGS: Intrauterine gestational sac: Single Yolk sac:  Visualized. Embryo:  Visualized. Cardiac Activity: Visualized. Heart Rate: 152 bpm CRL:  15.4 mm   7 w   6 d                  [place] [facility]: February 14, 2021 Subchorionic hemorrhage:  Small Maternal uterus/adnexae: The bilateral ovaries are visualized and are normal in appearance. No pelvic free fluid is seen. IMPRESSION: 1. Single, viable intrauterine pregnancy at approximately 7 weeks and 6 days gestation by ultrasound evaluation. 2. Small subchorionic hemorrhage. Electronically Signed   By: Aram Candela M.D.   On: 07/04/2020 19:09   Assessment and Plan  --19 y.o. G2P1001 with SIUP at [redacted]w[redacted]d  --Subchorionic hematoma, pelvic rest advised --Blood type A POS --Discharge home in stable condition  F/U: --Patient desires follow-up with [facility]   Calvert Cantor, CNM 07/04/2020, 8:37 PM

## 2020-07-26 NOTE — L&D Delivery Note (Signed)
OB/GYN Faculty Practice Delivery Note  Rebekah Sanders is a 20 y.o. G2P1001 at [redacted]w[redacted]d admitted for elective IOL for term with favorable cervix.   GBS Status: Negative/-- (06/20 1332) Maximum Maternal Temperature: 98.5  Labor course: Initial SVE: 4/50/posterior/vertex. Augmentation with: AROM and Pitocin. She then progressed to complete.  ROM: 1h 54m with clear fluid  Birth: At 1:00 PM a viable female was delivered via spontaneous vaginal delivery (Presentation: ROA). Tight nuchal, shoulder and body cord present: Yes.  Shoulders and body delivered in usual fashion. Infant placed directly on mom's abdomen after somersaulting baby out of the cord for bonding/skin-to-skin, baby dried and stimulated. Cord clamped x 2 after 1 minute and cut by FOB, Marquise.  Cord blood collected.  The placenta separated spontaneously and delivered via gentle cord traction.  Pitocin infused rapidly IV per protocol.  Fundus firm with massage.  Placenta inspected and appears to be intact with a 3 VC.  Placenta/Cord with the following complications: none .  Cord pH: not indicated. Sponge and instrument count were correct x2.  Intrapartum complications:  None Anesthesia:  none Episiotomy: none Lacerations:  2nd degree, vaginal, and perineal Suture Repair: 2.0 vicryl rapide EBL (mL): 200   Infant: APGAR (1 MIN): 9 APGAR (5 MINS):  9 Infant weight: 7 lbs 6.2 oz (3351 gm)  Mom to postpartum.  Baby to Couplet care / Skin to Skin. Placenta to L&D   Plans to Breast and bottlefeed Contraception: Nexplanon Circumcision: N/A  Note sent to [facility]: [facility] for pp visit.  Raelyn Mora , MSN, CNM 02/09/2021 2:04 PM

## 2020-08-11 ENCOUNTER — Encounter: Payer: Medicaid Other | Admitting: Obstetrics and Gynecology

## 2020-08-12 ENCOUNTER — Telehealth: Payer: Self-pay | Admitting: Family Medicine

## 2020-08-20 ENCOUNTER — Encounter: Payer: Self-pay | Admitting: *Deleted

## 2020-08-20 ENCOUNTER — Other Ambulatory Visit (HOSPITAL_COMMUNITY)
Admission: RE | Admit: 2020-08-20 | Discharge: 2020-08-20 | Disposition: A | Discharge status: Home / Self Care | Payer: Medicaid Other | Source: Ambulatory Visit | Attending: Obstetrics and Gynecology | Admitting: Obstetrics and Gynecology

## 2020-08-20 ENCOUNTER — Ambulatory Visit (INDEPENDENT_AMBULATORY_CARE_PROVIDER_SITE_OTHER): Payer: Medicaid Other | Admitting: Family Medicine

## 2020-08-20 ENCOUNTER — Other Ambulatory Visit: Payer: Self-pay

## 2020-08-20 ENCOUNTER — Encounter: Payer: Self-pay | Admitting: Family Medicine

## 2020-08-20 VITALS — BP 128/83 | HR 105 | Wt 214.6 lb

## 2020-08-20 DIAGNOSIS — O09891 Supervision of other high risk pregnancies, first trimester: Secondary | ICD-10-CM | POA: Insufficient documentation

## 2020-08-20 DIAGNOSIS — O99210 Obesity complicating pregnancy, unspecified trimester: Secondary | ICD-10-CM | POA: Insufficient documentation

## 2020-08-20 DIAGNOSIS — G4733 Obstructive sleep apnea (adult) (pediatric): Secondary | ICD-10-CM

## 2020-08-20 DIAGNOSIS — Z3490 Encounter for supervision of normal pregnancy, unspecified, unspecified trimester: Secondary | ICD-10-CM | POA: Insufficient documentation

## 2020-08-20 LAB — POCT URINALYSIS DIP (DEVICE)
Bilirubin Urine: NEGATIVE
Glucose, UA: NEGATIVE mg/dL
Hgb urine dipstick: NEGATIVE
Ketones, ur: NEGATIVE mg/dL
Leukocytes,Ua: NEGATIVE
Nitrite: NEGATIVE
Protein, ur: NEGATIVE mg/dL
Specific Gravity, Urine: >=1.03 (ref 1.005–1.030)
Urobilinogen, UA: 0.2 mg/dL (ref 0.0–1.0)
pH: 6 (ref 5.0–8.0)

## 2020-08-20 NOTE — Progress Notes (Signed)
Here for intial prenatal visit. Given new ob booklets. Has bp cuff. Sent Babyscripts. Medicaid Home form completed. Desires panorama.  Linda,RN

## 2020-08-20 NOTE — Progress Notes (Signed)
Subjective:  Rebekah Sanders is a G2P1001 [redacted]w[redacted]d being seen today for her first obstetrical visit.  Her obstetrical history is significant for previous normal spontaneous delivery of term infant. Short interval of pregnancy. Patient does intend to breast feed. Pregnancy history fully reviewed.  Patient reports no complaints.  BP 128/83   Pulse (!) 105   Wt 214 lb 9.6 oz (97.3 kg)   LMP 05/05/2020   BMI 33.61 kg/m   HISTORY: OB History  Gravida Para Term Preterm AB Living  2 1 1     1   SAB IAB Ectopic Multiple Live Births        0 1    # Outcome Date GA Lbr Len/2nd Weight Sex Delivery Anes PTL Lv  2 Current           1 Term 02/03/20 104w4d / 00:10 7 lb 13.9 oz (3.57 kg) M Vag-Spont None  LIV    Past Medical History:  Diagnosis Date  . ADHD (attention deficit hyperactivity disorder)   . Asthma   . Auditory hallucinations   . Bipolar 1 disorder (HCC)   . Chlamydia   . Gonorrhea   . Oppositional defiant disorder   . Trichomonosis     Past Surgical History:  Procedure Laterality Date  . TONSILLECTOMY AND ADENOIDECTOMY Bilateral 03/2016    Family History  Problem Relation Age of Onset  . Bipolar disorder Other        Maternal grandparents  . Depression Other        Parents  . Drug abuse Other        multiple family members by report  . Diabetes Mother      Exam  BP 128/83   Pulse (!) 105   Wt 214 lb 9.6 oz (97.3 kg)   LMP 05/05/2020   BMI 33.61 kg/m   Chaperone present during exam  CONSTITUTIONAL: Well-developed, well-nourished female in no acute distress.  HENT:  Normocephalic, atraumatic, External right and left ear normal. Oropharynx is clear and moist EYES: Conjunctivae and EOM are normal. Pupils are equal, round, and reactive to light. No scleral icterus.  NECK: Normal range of motion, supple, no masses.  Normal thyroid.  CARDIOVASCULAR: Normal heart rate noted, regular rhythm RESPIRATORY: Clear to auscultation bilaterally. Effort and breath  sounds normal, no problems with respiration noted. BREASTS: Symmetric in size. No masses, skin changes, nipple drainage, or lymphadenopathy. ABDOMEN: Soft, normal bowel sounds, no distention noted.  No tenderness, rebound or guarding.  PELVIC: Normal appearing external genitalia. MUSCULOSKELETAL: Normal range of motion. No tenderness.  No cyanosis, clubbing, or edema.  2+ distal pulses. SKIN: Skin is warm and dry. No rash noted. Not diaphoretic. No erythema. No pallor. NEUROLOGIC: Alert and oriented to person, place, and time. Normal reflexes, muscle tone coordination. No cranial nerve deficit noted. PSYCHIATRIC: Normal mood and affect. Normal behavior. Normal judgment and thought content.    Assessment:    Pregnancy: G2P1001 Patient Active Problem List   Diagnosis Date Noted  . Supervision of low-risk pregnancy 08/20/2020  . Obesity in pregnancy 08/20/2020  . Short interval between pregnancies affecting pregnancy in first trimester, antepartum 08/20/2020  . Fall due to ice or snow 07/23/2019  . Trichomoniasis 07/12/2019  . Low ferritin 03/03/2018  . Acne vulgaris 03/03/2018  . OSA (obstructive sleep apnea) 05/26/2016  . Screening for STD (sexually transmitted disease) 03/13/2013  . ADHD (attention deficit hyperactivity disorder), combined type 08/08/2012      Plan:   1. Encounter for supervision of  low-risk pregnancy, antepartum FHT and FH normal.  Discussed practice - CBC/D/Plt+RPR+Rh+ABO+Rub Ab... - Hemoglobin A1c - Culture, OB Urine - GC/Chlamydia probe amp ([place])not at [facility] - Genetic Screening - [facility] AMB BABYSCRIPTS SCHEDULE OPTIMIZATION - [place] MFM OB DETAIL +14 WK; Future  2. Obesity in pregnancy - CBC/D/Plt+RPR+Rh+ABO+Rub Ab... - Hemoglobin A1c - Culture, OB Urine - GC/Chlamydia probe amp ([place])not at [facility] - Genetic Screening - [facility] AMB BABYSCRIPTS SCHEDULE OPTIMIZATION - [place] MFM OB DETAIL +14 WK; Future  3. OSA (obstructive sleep apnea)  4. Short  interval between pregnancies affecting pregnancy in first trimester, antepartum Discussed increased risk of preterm delivery.     Problem list reviewed and updated. 75% of 30 min visit spent on counseling and coordination of care.     Levie Heritage 08/20/2020

## 2020-08-21 LAB — CBC/D/PLT+RPR+RH+ABO+RUB AB...
Antibody Screen: NEGATIVE
Basophils Absolute: 0 10*3/uL (ref 0.0–0.2)
Basos: 0 %
EOS (ABSOLUTE): 0 10*3/uL (ref 0.0–0.4)
Eos: 0 %
HCV Ab: <0.1 s/co ratio (ref 0.0–0.9)
HIV Screen 4th Generation wRfx: NONREACTIVE
Hematocrit: 38.6 % (ref 34.0–46.6)
Hemoglobin: 12.8 g/dL (ref 11.1–15.9)
Hepatitis B Surface Ag: NEGATIVE
Immature Grans (Abs): 0 10*3/uL (ref 0.0–0.1)
Immature Granulocytes: 0 %
Lymphocytes Absolute: 3 10*3/uL (ref 0.7–3.1)
Lymphs: 32 %
MCH: 31.2 pg (ref 26.6–33.0)
MCHC: 33.2 g/dL (ref 31.5–35.7)
MCV: 94 fL (ref 79–97)
Monocytes Absolute: 0.3 10*3/uL (ref 0.1–0.9)
Monocytes: 4 %
Neutrophils Absolute: 5.9 10*3/uL (ref 1.4–7.0)
Neutrophils: 64 %
Platelets: 308 10*3/uL (ref 150–450)
RBC: 4.1 x10E6/uL (ref 3.77–5.28)
RDW: 12.8 % (ref 11.7–15.4)
RPR Ser Ql: NONREACTIVE
Rh Factor: POSITIVE
Rubella Antibodies, IGG: 1.66 index (ref >0.99)
WBC: 9.3 10*3/uL (ref 3.4–10.8)

## 2020-08-21 LAB — GC/CHLAMYDIA PROBE AMP (~~LOC~~) NOT AT ARMC
Chlamydia: NEGATIVE
Comment: NEGATIVE
Comment: NORMAL
Neisseria Gonorrhea: NEGATIVE

## 2020-08-21 LAB — HCV INTERPRETATION

## 2020-08-21 LAB — HEMOGLOBIN A1C
Est. average glucose Bld gHb Est-mCnc: 108 mg/dL
Hgb A1c MFr Bld: 5.4 % (ref 4.8–5.6)

## 2020-08-22 LAB — URINE CULTURE, OB REFLEX

## 2020-08-22 LAB — CULTURE, OB URINE

## 2020-09-01 NOTE — Telephone Encounter (Signed)
error 

## 2020-09-03 ENCOUNTER — Other Ambulatory Visit: Payer: Self-pay

## 2020-09-03 ENCOUNTER — Emergency Department (HOSPITAL_COMMUNITY): Payer: Medicaid Other

## 2020-09-03 ENCOUNTER — Emergency Department (HOSPITAL_COMMUNITY)
Admission: EM | Admit: 2020-09-03 | Discharge: 2020-09-03 | Disposition: A | Discharge status: Home / Self Care | Payer: Medicaid Other | Attending: Emergency Medicine | Admitting: Emergency Medicine

## 2020-09-03 ENCOUNTER — Encounter (HOSPITAL_COMMUNITY): Payer: Self-pay

## 2020-09-03 DIAGNOSIS — J45909 Unspecified asthma, uncomplicated: Secondary | ICD-10-CM | POA: Insufficient documentation

## 2020-09-03 DIAGNOSIS — Z87891 Personal history of nicotine dependence: Secondary | ICD-10-CM | POA: Insufficient documentation

## 2020-09-03 DIAGNOSIS — S60141A Contusion of right ring finger with damage to nail, initial encounter: Secondary | ICD-10-CM | POA: Diagnosis not present

## 2020-09-03 DIAGNOSIS — S6991XA Unspecified injury of right wrist, hand and finger(s), initial encounter: Secondary | ICD-10-CM | POA: Diagnosis present

## 2020-09-03 DIAGNOSIS — X58XXXA Exposure to other specified factors, initial encounter: Secondary | ICD-10-CM | POA: Diagnosis not present

## 2020-09-03 DIAGNOSIS — S62639A Displaced fracture of distal phalanx of unspecified finger, initial encounter for closed fracture: Secondary | ICD-10-CM

## 2020-09-03 DIAGNOSIS — S62634A Displaced fracture of distal phalanx of right ring finger, initial encounter for closed fracture: Secondary | ICD-10-CM | POA: Insufficient documentation

## 2020-09-03 NOTE — ED Notes (Signed)
Pt left without her Discharge papers. Said she does not need it and will look in her mychart and walked out
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
## 2020-09-03 NOTE — ED Triage Notes (Signed)
Pt reports pain to right ring finger for the past 2 weeks, pt unable to straighten out her finger, denies any injury or trauma. Pt is [redacted] weeks pregnant

## 2020-09-03 NOTE — ED Notes (Signed)
Pt left w/o dc papers.

## 2020-09-15 ENCOUNTER — Other Ambulatory Visit: Payer: Self-pay | Admitting: *Deleted

## 2020-09-15 ENCOUNTER — Encounter: Payer: Self-pay | Admitting: *Deleted

## 2020-09-15 ENCOUNTER — Ambulatory Visit: Payer: Medicaid Other | Admitting: *Deleted

## 2020-09-15 ENCOUNTER — Ambulatory Visit: Payer: Medicaid Other | Attending: Family Medicine

## 2020-09-15 ENCOUNTER — Other Ambulatory Visit: Payer: Self-pay

## 2020-09-15 DIAGNOSIS — Z362 Encounter for other antenatal screening follow-up: Secondary | ICD-10-CM

## 2020-09-15 DIAGNOSIS — O99210 Obesity complicating pregnancy, unspecified trimester: Secondary | ICD-10-CM | POA: Diagnosis not present

## 2020-09-15 DIAGNOSIS — Z3490 Encounter for supervision of normal pregnancy, unspecified, unspecified trimester: Secondary | ICD-10-CM | POA: Insufficient documentation

## 2020-09-19 ENCOUNTER — Encounter: Payer: Self-pay | Admitting: *Deleted

## 2020-09-23 ENCOUNTER — Emergency Department (HOSPITAL_COMMUNITY)
Admission: EM | Admit: 2020-09-23 | Discharge: 2020-09-23 | Disposition: A | Discharge status: Home / Self Care | Payer: Medicaid Other | Attending: Emergency Medicine | Admitting: Emergency Medicine

## 2020-09-23 ENCOUNTER — Other Ambulatory Visit: Payer: Self-pay

## 2020-09-23 ENCOUNTER — Encounter (HOSPITAL_COMMUNITY): Payer: Self-pay | Admitting: Emergency Medicine

## 2020-09-23 ENCOUNTER — Emergency Department (HOSPITAL_COMMUNITY): Payer: Medicaid Other

## 2020-09-23 ENCOUNTER — Emergency Department (HOSPITAL_BASED_OUTPATIENT_CLINIC_OR_DEPARTMENT_OTHER): Payer: Medicaid Other

## 2020-09-23 DIAGNOSIS — M25561 Pain in right knee: Secondary | ICD-10-CM | POA: Insufficient documentation

## 2020-09-23 DIAGNOSIS — R319 Hematuria, unspecified: Secondary | ICD-10-CM | POA: Insufficient documentation

## 2020-09-23 DIAGNOSIS — Y9289 Other specified places as the place of occurrence of the external cause: Secondary | ICD-10-CM | POA: Insufficient documentation

## 2020-09-23 DIAGNOSIS — O99513 Diseases of the respiratory system complicating pregnancy, third trimester: Secondary | ICD-10-CM | POA: Diagnosis not present

## 2020-09-23 DIAGNOSIS — O9A212 Injury, poisoning and certain other consequences of external causes complicating pregnancy, second trimester: Secondary | ICD-10-CM | POA: Diagnosis not present

## 2020-09-23 DIAGNOSIS — S60032A Contusion of left middle finger without damage to nail, initial encounter: Secondary | ICD-10-CM | POA: Insufficient documentation

## 2020-09-23 DIAGNOSIS — Z87891 Personal history of nicotine dependence: Secondary | ICD-10-CM | POA: Diagnosis not present

## 2020-09-23 DIAGNOSIS — R1084 Generalized abdominal pain: Secondary | ICD-10-CM | POA: Insufficient documentation

## 2020-09-23 DIAGNOSIS — M25522 Pain in left elbow: Secondary | ICD-10-CM | POA: Diagnosis not present

## 2020-09-23 DIAGNOSIS — O26892 Other specified pregnancy related conditions, second trimester: Secondary | ICD-10-CM | POA: Insufficient documentation

## 2020-09-23 DIAGNOSIS — O99891 Other specified diseases and conditions complicating pregnancy: Secondary | ICD-10-CM | POA: Insufficient documentation

## 2020-09-23 DIAGNOSIS — T1490XA Injury, unspecified, initial encounter: Secondary | ICD-10-CM

## 2020-09-23 DIAGNOSIS — J45909 Unspecified asthma, uncomplicated: Secondary | ICD-10-CM | POA: Diagnosis not present

## 2020-09-23 DIAGNOSIS — Z3A20 20 weeks gestation of pregnancy: Secondary | ICD-10-CM | POA: Insufficient documentation

## 2020-09-23 DIAGNOSIS — O9A219 Injury, poisoning and certain other consequences of external causes complicating pregnancy, unspecified trimester: Secondary | ICD-10-CM

## 2020-09-23 DIAGNOSIS — M79645 Pain in left finger(s): Secondary | ICD-10-CM

## 2020-09-23 LAB — CBC WITH DIFFERENTIAL/PLATELET
Abs Immature Granulocytes: 0.04 10*3/uL (ref 0.00–0.07)
Basophils Absolute: 0 10*3/uL (ref 0.0–0.1)
Basophils Relative: 0 %
Eosinophils Absolute: 0 10*3/uL (ref 0.0–0.5)
Eosinophils Relative: 0 %
HCT: 37.2 % (ref 36.0–46.0)
Hemoglobin: 12.7 g/dL (ref 12.0–15.0)
Immature Granulocytes: 0 %
Lymphocytes Relative: 31 %
Lymphs Abs: 3.2 10*3/uL (ref 0.7–4.0)
MCH: 32.7 pg (ref 26.0–34.0)
MCHC: 34.1 g/dL (ref 30.0–36.0)
MCV: 95.9 fL (ref 80.0–100.0)
Monocytes Absolute: 0.4 10*3/uL (ref 0.1–1.0)
Monocytes Relative: 4 %
Neutro Abs: 6.8 10*3/uL (ref 1.7–7.7)
Neutrophils Relative %: 65 %
Platelets: 260 10*3/uL (ref 150–400)
RBC: 3.88 MIL/uL (ref 3.87–5.11)
RDW: 12.8 % (ref 11.5–15.5)
WBC: 10.5 10*3/uL (ref 4.0–10.5)
nRBC: 0 % (ref 0.0–0.2)

## 2020-09-23 LAB — URINALYSIS, ROUTINE W REFLEX MICROSCOPIC
Bilirubin Urine: NEGATIVE
Glucose, UA: NEGATIVE mg/dL
Hgb urine dipstick: NEGATIVE
Ketones, ur: 5 mg/dL — AB
Leukocytes,Ua: NEGATIVE
Nitrite: NEGATIVE
Protein, ur: NEGATIVE mg/dL
Specific Gravity, Urine: 1.026 (ref 1.005–1.030)
pH: 6 (ref 5.0–8.0)

## 2020-09-23 LAB — I-STAT BETA HCG BLOOD, ED (MC, WL, AP ONLY): I-stat hCG, quantitative: >2000 m[IU]/mL — ABNORMAL HIGH (ref <5)

## 2020-09-23 LAB — TYPE AND SCREEN
ABO/RH(D): A POS
Antibody Screen: NEGATIVE

## 2020-09-23 LAB — BASIC METABOLIC PANEL
Anion gap: 5 (ref 5–15)
BUN: 5 mg/dL — ABNORMAL LOW (ref 6–20)
CO2: 23 mmol/L (ref 22–32)
Calcium: 8.4 mg/dL — ABNORMAL LOW (ref 8.9–10.3)
Chloride: 106 mmol/L (ref 98–111)
Creatinine, Ser: 0.63 mg/dL (ref 0.44–1.00)
GFR, Estimated: >60 mL/min (ref >60)
Glucose, Bld: 79 mg/dL (ref 70–99)
Potassium: 3.3 mmol/L — ABNORMAL LOW (ref 3.5–5.1)
Sodium: 134 mmol/L — ABNORMAL LOW (ref 135–145)

## 2020-09-23 LAB — HCG, QUANTITATIVE, PREGNANCY: hCG, Beta Chain, Quant, S: 42617 m[IU]/mL — ABNORMAL HIGH (ref <5)

## 2020-09-23 NOTE — ED Notes (Signed)
Patient transported to X-ray 
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
## 2020-09-23 NOTE — ED Notes (Signed)
Pt reported during ultra sound FHT was 146 seen on monitor

## 2020-09-23 NOTE — ED Notes (Signed)
Pt is very agitated and refuses to be on monitor. Pt states she is ready leave.

## 2020-09-23 NOTE — ED Triage Notes (Signed)
Pt arrives to ED with c/o of left elbow pain. Unsure how it started. Reports minimal swelling.

## 2020-09-23 NOTE — ED Notes (Signed)
Pt states she is pregnant and would like for her baby to be check on once she  Is in the back to see a provider.

## 2020-09-23 NOTE — ED Notes (Signed)
Patient transported to [place],
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
## 2020-09-23 NOTE — ED Notes (Signed)
OB rapid response Erin notified.

## 2020-10-14 ENCOUNTER — Encounter: Payer: Self-pay | Admitting: *Deleted

## 2020-10-14 ENCOUNTER — Other Ambulatory Visit: Payer: Self-pay

## 2020-10-14 ENCOUNTER — Ambulatory Visit (HOSPITAL_BASED_OUTPATIENT_CLINIC_OR_DEPARTMENT_OTHER): Payer: Medicaid Other

## 2020-10-14 ENCOUNTER — Ambulatory Visit: Payer: Medicaid Other | Attending: Obstetrics | Admitting: *Deleted

## 2020-10-14 ENCOUNTER — Other Ambulatory Visit: Payer: Self-pay | Admitting: *Deleted

## 2020-10-14 DIAGNOSIS — E669 Obesity, unspecified: Secondary | ICD-10-CM | POA: Diagnosis not present

## 2020-10-14 DIAGNOSIS — Z362 Encounter for other antenatal screening follow-up: Secondary | ICD-10-CM

## 2020-10-14 DIAGNOSIS — Z3A23 23 weeks gestation of pregnancy: Secondary | ICD-10-CM

## 2020-10-14 DIAGNOSIS — O99212 Obesity complicating pregnancy, second trimester: Secondary | ICD-10-CM | POA: Insufficient documentation

## 2020-10-14 DIAGNOSIS — O09892 Supervision of other high risk pregnancies, second trimester: Secondary | ICD-10-CM | POA: Diagnosis not present

## 2020-10-14 DIAGNOSIS — O99210 Obesity complicating pregnancy, unspecified trimester: Secondary | ICD-10-CM

## 2020-11-04 ENCOUNTER — Ambulatory Visit (INDEPENDENT_AMBULATORY_CARE_PROVIDER_SITE_OTHER): Payer: Medicaid Other | Admitting: Obstetrics and Gynecology

## 2020-11-04 ENCOUNTER — Other Ambulatory Visit: Payer: Self-pay

## 2020-11-04 VITALS — BP 110/73 | HR 98 | Wt 233.9 lb

## 2020-11-04 DIAGNOSIS — Z23 Encounter for immunization: Secondary | ICD-10-CM

## 2020-11-04 DIAGNOSIS — Z3490 Encounter for supervision of normal pregnancy, unspecified, unspecified trimester: Secondary | ICD-10-CM

## 2020-11-04 DIAGNOSIS — Z3492 Encounter for supervision of normal pregnancy, unspecified, second trimester: Secondary | ICD-10-CM | POA: Diagnosis not present
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
## 2020-11-12 ENCOUNTER — Other Ambulatory Visit: Payer: Self-pay | Admitting: Lactation Services

## 2020-11-12 DIAGNOSIS — Z3490 Encounter for supervision of normal pregnancy, unspecified, unspecified trimester: Secondary | ICD-10-CM

## 2020-11-14 ENCOUNTER — Other Ambulatory Visit: Payer: Medicaid Other

## 2020-11-24 ENCOUNTER — Encounter: Payer: Self-pay | Admitting: *Deleted

## 2020-11-24 ENCOUNTER — Ambulatory Visit: Payer: Medicaid Other | Attending: Obstetrics

## 2020-11-24 ENCOUNTER — Other Ambulatory Visit: Payer: Self-pay

## 2020-11-24 ENCOUNTER — Ambulatory Visit: Payer: Medicaid Other | Admitting: *Deleted

## 2020-11-24 DIAGNOSIS — Z3A29 29 weeks gestation of pregnancy: Secondary | ICD-10-CM | POA: Diagnosis not present

## 2020-11-24 DIAGNOSIS — O99213 Obesity complicating pregnancy, third trimester: Secondary | ICD-10-CM

## 2020-11-24 DIAGNOSIS — O99210 Obesity complicating pregnancy, unspecified trimester: Secondary | ICD-10-CM | POA: Diagnosis present

## 2020-11-24 DIAGNOSIS — O09893 Supervision of other high risk pregnancies, third trimester: Secondary | ICD-10-CM | POA: Diagnosis not present

## 2020-11-24 DIAGNOSIS — E669 Obesity, unspecified: Secondary | ICD-10-CM | POA: Diagnosis not present

## 2020-11-27 ENCOUNTER — Other Ambulatory Visit: Payer: Self-pay

## 2020-11-27 ENCOUNTER — Other Ambulatory Visit: Payer: Medicaid Other

## 2020-11-28 LAB — CBC
Hematocrit: 37.6 % (ref 34.0–46.6)
Hemoglobin: 12.5 g/dL (ref 11.1–15.9)
MCH: 31.2 pg (ref 26.6–33.0)
MCHC: 33.2 g/dL (ref 31.5–35.7)
MCV: 94 fL (ref 79–97)
Platelets: 312 10*3/uL (ref 150–450)
RBC: 4.01 x10E6/uL (ref 3.77–5.28)
RDW: 13.8 % (ref 11.7–15.4)
WBC: 10.2 10*3/uL (ref 3.4–10.8)

## 2020-11-28 LAB — HIV ANTIBODY (ROUTINE TESTING W REFLEX): HIV Screen 4th Generation wRfx: NONREACTIVE

## 2020-11-28 LAB — RPR: RPR Ser Ql: NONREACTIVE

## 2020-11-28 LAB — GLUCOSE TOLERANCE, 2 HOURS W/ 1HR
Glucose, 1 hour: 98 mg/dL (ref 65–179)
Glucose, 2 hour: 94 mg/dL (ref 65–152)
Glucose, Fasting: 80 mg/dL (ref 65–91)

## 2020-12-01 ENCOUNTER — Ambulatory Visit (INDEPENDENT_AMBULATORY_CARE_PROVIDER_SITE_OTHER): Payer: Medicaid Other | Admitting: Nurse Practitioner

## 2020-12-01 ENCOUNTER — Other Ambulatory Visit: Payer: Self-pay

## 2020-12-01 VITALS — BP 121/77 | HR 116 | Wt 237.0 lb

## 2020-12-01 DIAGNOSIS — Z3490 Encounter for supervision of normal pregnancy, unspecified, unspecified trimester: Secondary | ICD-10-CM

## 2020-12-01 DIAGNOSIS — Z3A30 30 weeks gestation of pregnancy: Secondary | ICD-10-CM

## 2020-12-01 NOTE — Patient Instructions (Addendum)

## 2020-12-01 NOTE — Progress Notes (Signed)
    Subjective:  Rebekah Sanders is a 20 y.o. G2P1001 at [redacted]w[redacted]d being seen today for ongoing prenatal care.  She is currently monitored for the following issues for this low-risk pregnancy and has ADHD (attention deficit hyperactivity disorder), combined type; Screening for STD (sexually transmitted disease); OSA (obstructive sleep apnea); Low ferritin; Acne vulgaris; Trichomoniasis; Fall due to ice or snow; Supervision of low-risk pregnancy; Obesity in pregnancy; and Short interval between pregnancies affecting pregnancy in first trimester, antepartum on their problem list.  Patient reports small amount of bleeding after having a hard stool.  Contractions: Irritability. Vag. Bleeding: None.  Movement: Present. Denies leaking of fluid.   The following portions of the patient's history were reviewed and updated as appropriate: allergies, current medications, past family history, past medical history, past social history, past surgical history and problem list. Problem list updated.  Objective:   Vitals:   12/01/20 1409  BP: 121/77  Pulse: (!) 116  Weight: 237 lb (107.5 kg)    Fetal Status: Fetal Heart Rate (bpm): 150 Fundal Height: 29 cm Movement: Present     General:  Alert, oriented and cooperative. Patient is in no acute distress.  Skin: Skin is warm and dry. No rash noted.   Cardiovascular: Normal heart rate noted  Respiratory: Normal respiratory effort, no problems with respiration noted  Abdomen: Soft, gravid, appropriate for gestational age. Pain/Pressure: Absent     Pelvic:  Cervical exam deferred        Extremities: Normal range of motion.  Edema: None  Mental Status: Normal mood and affect. Normal behavior. Normal judgment and thought content.   Urinalysis:      Assessment and Plan:  Pregnancy: G2P1001 at [redacted]w[redacted]d  1. Encounter for supervision of low-risk pregnancy, antepartum Wants Liletta or Mirena IUD - wants minimal bleeding with menses Drink at least 8 8-oz glasses  of water every day. Incorporate high fiber foods in diet. Declines stool softener. Advised that current amount of bleeding is normal and let the office know if it is worsening.  Preterm labor symptoms and general obstetric precautions including but not limited to vaginal bleeding, contractions, leaking of fluid and fetal movement were reviewed in detail with the patient. Please refer to After Visit Summary for other counseling recommendations.  Return in about 2 weeks (around 12/15/2020) for in person ROB.  Nolene Bernheim, RN, MSN, NP-BC Nurse Practitioner, [facility] for [employer], [facility] 12/01/2020 2:41 PM

## 2020-12-18 ENCOUNTER — Other Ambulatory Visit: Payer: Self-pay

## 2020-12-18 ENCOUNTER — Ambulatory Visit (INDEPENDENT_AMBULATORY_CARE_PROVIDER_SITE_OTHER): Payer: Medicaid Other | Admitting: Certified Nurse Midwife

## 2020-12-18 VITALS — BP 120/81 | HR 93 | Wt 240.4 lb

## 2020-12-18 DIAGNOSIS — Z3A32 32 weeks gestation of pregnancy: Secondary | ICD-10-CM

## 2020-12-18 DIAGNOSIS — Z3493 Encounter for supervision of normal pregnancy, unspecified, third trimester: Secondary | ICD-10-CM

## 2020-12-18 NOTE — Progress Notes (Signed)
Patient states that she has been having daily pelvis pressure but other than that patient stated that she is feeling fine

## 2020-12-19 NOTE — Progress Notes (Signed)
   PRENATAL VISIT NOTE  Subjective:  Rebekah Sanders is a 20 y.o. G2P1001 at [redacted]w[redacted]d being seen today for ongoing prenatal care.  She is currently monitored for the following issues for this low-risk pregnancy and has ADHD (attention deficit hyperactivity disorder), combined type; Screening for STD (sexually transmitted disease); OSA (obstructive sleep apnea); Low ferritin; Acne vulgaris; Trichomoniasis; Fall due to ice or snow; Supervision of low-risk pregnancy; Obesity in pregnancy; and Short interval between pregnancies affecting pregnancy in first trimester, antepartum on their problem list.  Patient reports no complaints.  Contractions: Not present. Vag. Bleeding: None.  Movement: Present. Denies leaking of fluid.   The following portions of the patient's history were reviewed and updated as appropriate: allergies, current medications, past family history, past medical history, past social history, past surgical history and problem list.   Objective:   Vitals:   12/18/20 1318  BP: 120/81  Pulse: 93  Weight: 240 lb 6.4 oz (109 kg)    Fetal Status: Fetal Heart Rate (bpm): 152 Fundal Height: 32 cm Movement: Present     General:  Alert, oriented and cooperative. Patient is in no acute distress.  Skin: Skin is warm and dry. No rash noted.   Cardiovascular: Normal heart rate noted  Respiratory: Normal respiratory effort, no problems with respiration noted  Abdomen: Soft, gravid, appropriate for gestational age.  Pain/Pressure: Present     Pelvic: Cervical exam deferred        Extremities: Normal range of motion.  Edema: None  Mental Status: Normal mood and affect. Normal behavior. Normal judgment and thought content.   Assessment and Plan:  Pregnancy: G2P1001 at [redacted]w[redacted]d 1. Supervision of low-risk pregnancy, third trimester - Doing well, feeling vigorous fetal movement. Having some increased pelvic pressure but no contractions  2. [redacted] weeks gestation of pregnancy - Routine OB  care  Preterm labor symptoms and general obstetric precautions including but not limited to vaginal bleeding, contractions, leaking of fluid and fetal movement were reviewed in detail with the patient. Please refer to After Visit Summary for other counseling recommendations.   Return in about 2 weeks (around 01/01/2021) for IN-PERSON, LOB.  Future Appointments  Date Time Provider Department Center  01/12/2021 10:55 AM Burleson, Brand Males, NP [facility]    Bernerd Limbo, CNM

## 2020-12-22 IMAGING — US US OB < 14 WEEKS - US OB TV
1 series · 13 of 28 positions shown · non-contrast
Comparison: None.

CLINICAL DATA: First trimester pregnancy.  Pelvic pain for 2 days.



[Series 1: us ob < 14 weeks - us ob tv · 13 of 123 slices shown]
[im 5/123]
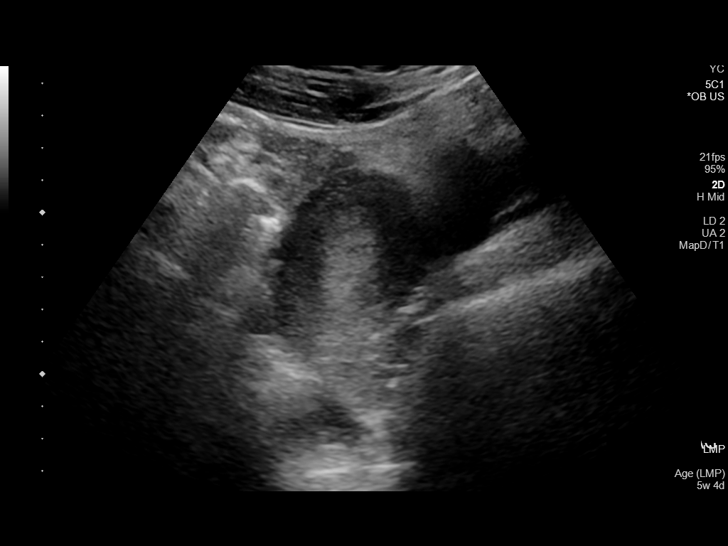
[im 14/123]
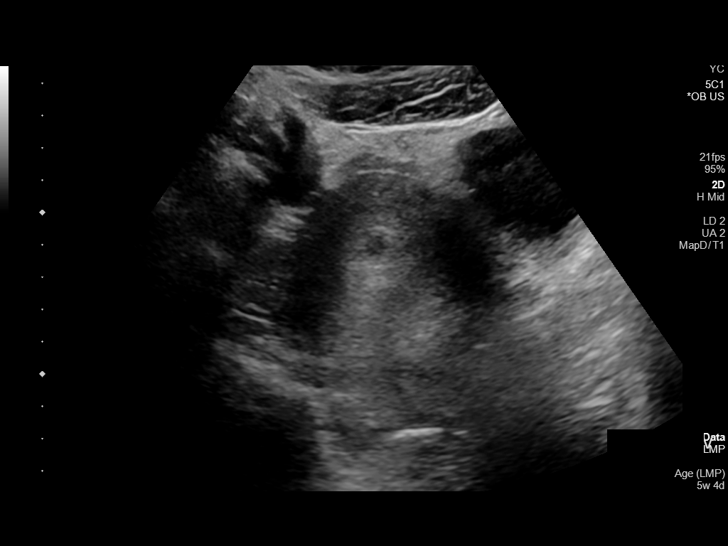
[im 23/123]
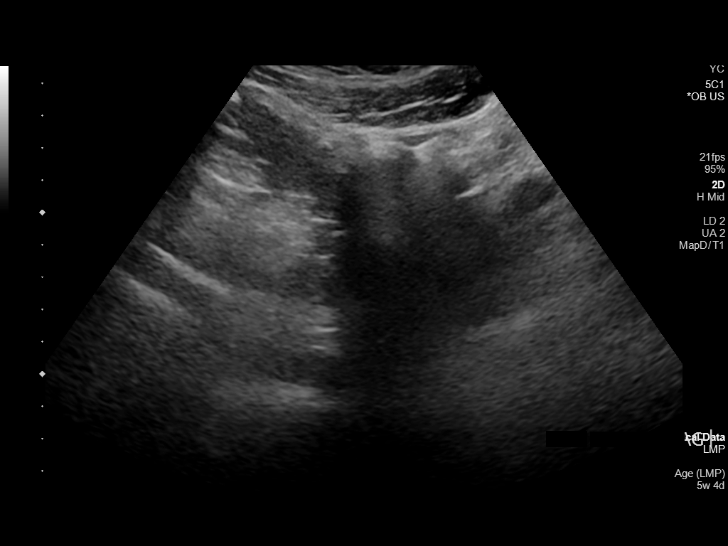
[im 32/123]
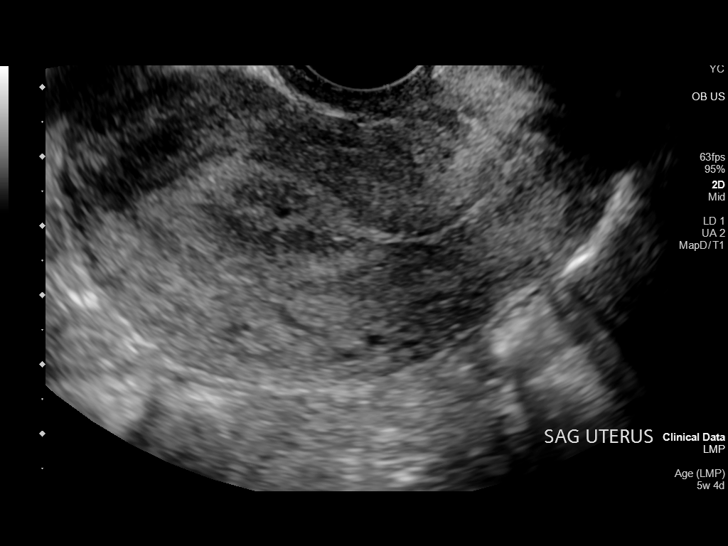
[im 41/123]
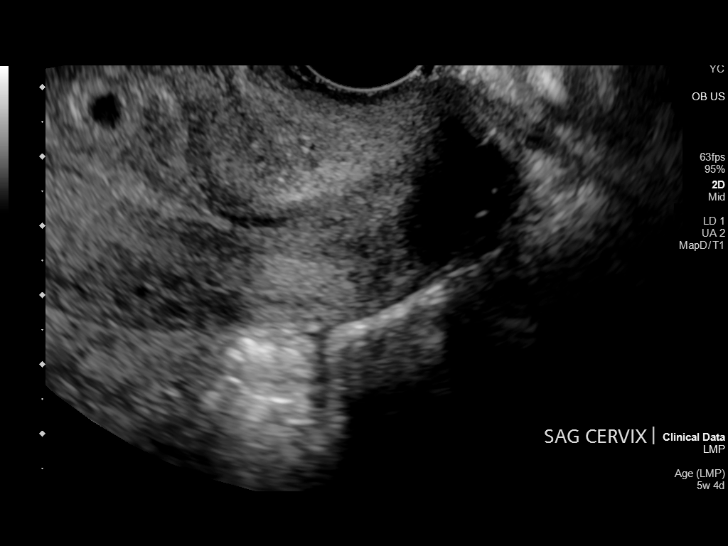
[im 50/123]
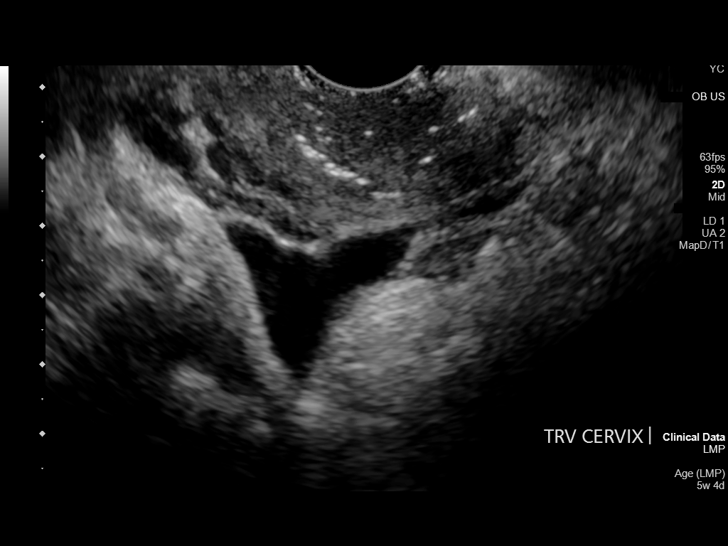
[im 64/123]
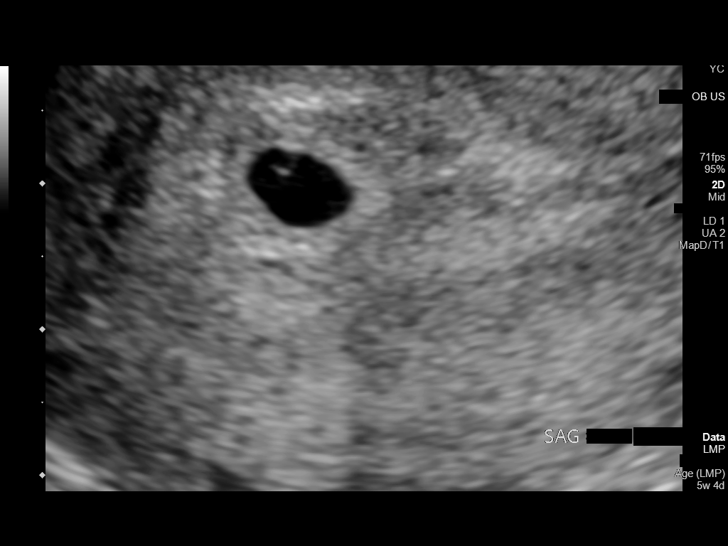
[im 73/123]
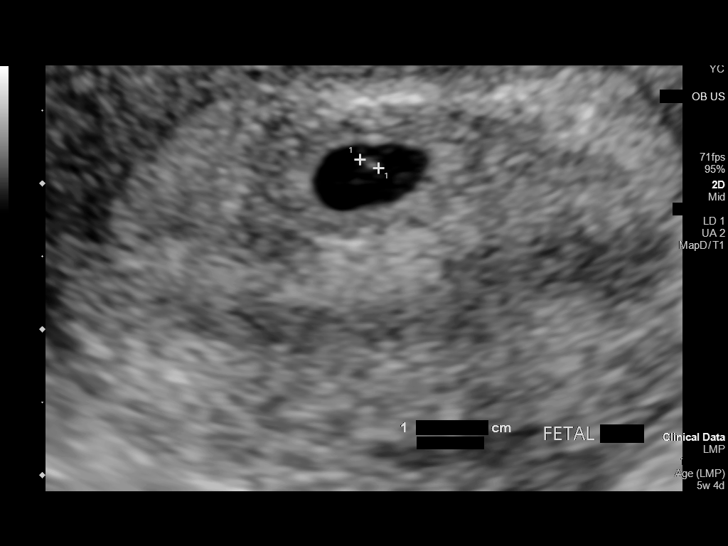
[im 82/123]
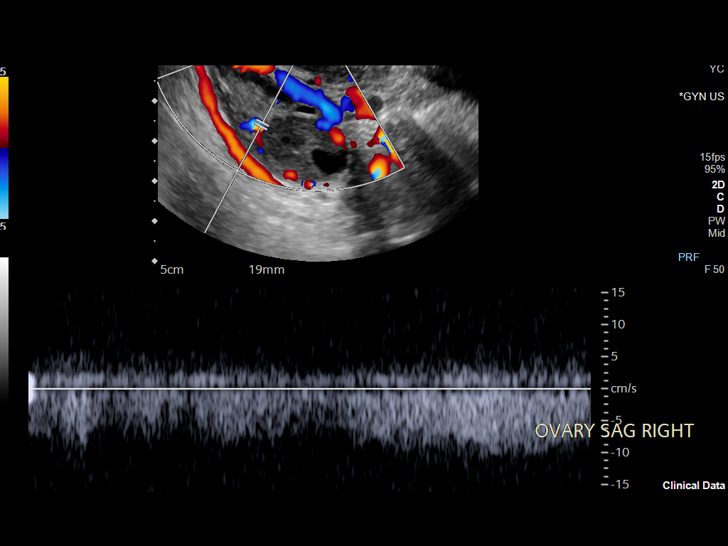
[im 91/123]
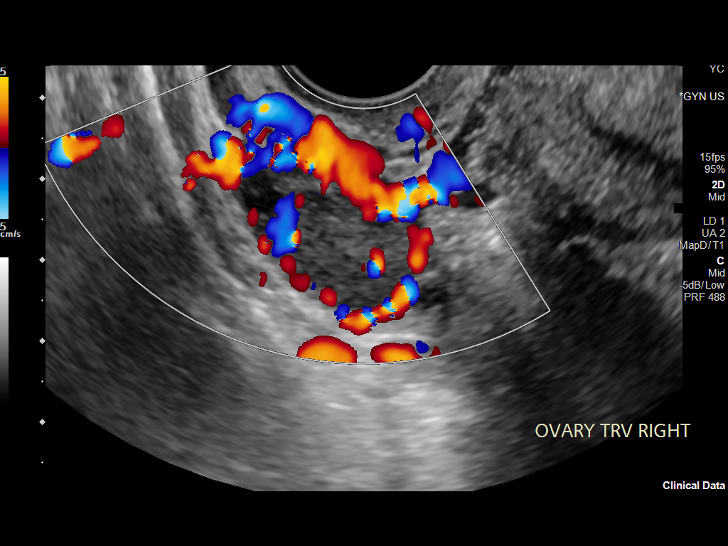
[im 100/123]
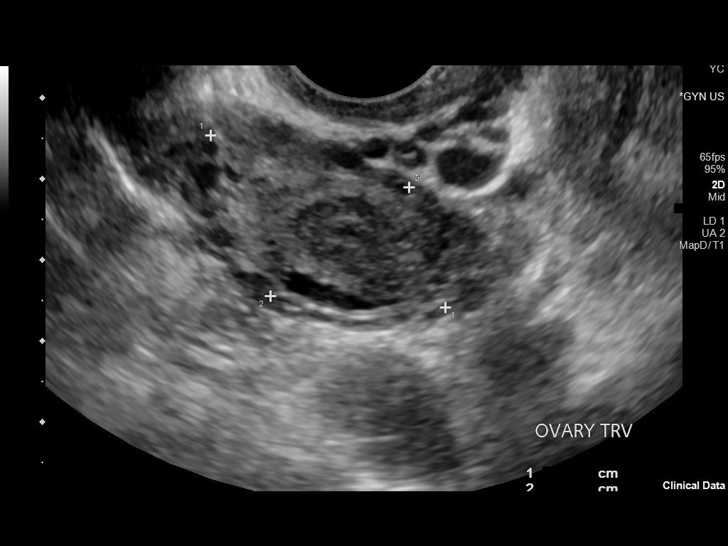
[im 109/123]
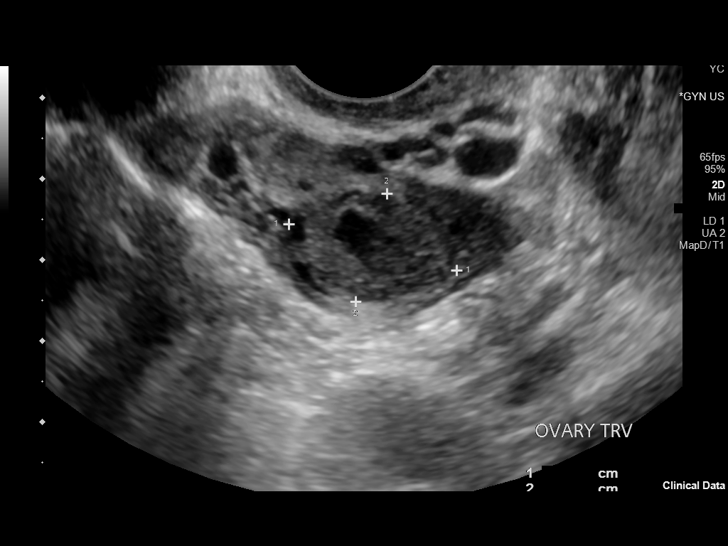
[im 118/123]
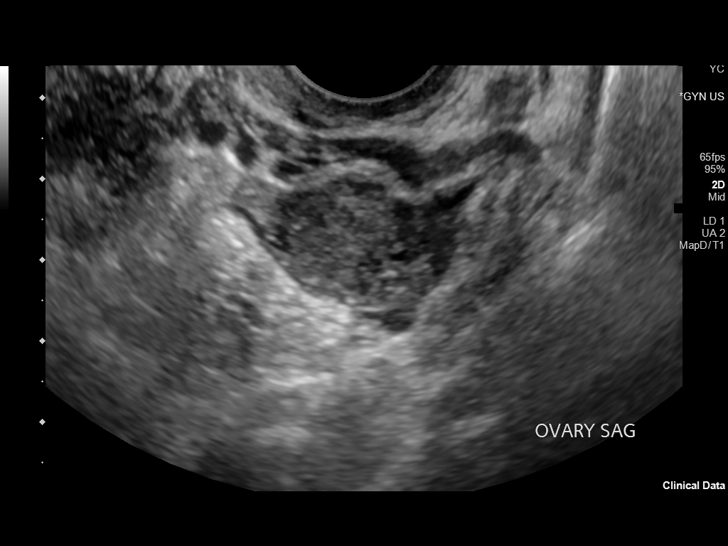

[13 of 28 positions shown; findings below may reference images not displayed]

FINDINGS: Intrauterine gestational sac: Single

Yolk sac:  Present

Embryo:  Possible

Cardiac Activity: Not visualized

MSD: 6.8 mm   5 w   3 d

Subchorionic hemorrhage:  None visualized.

Maternal uterus/adnexae: Uterus and adnexa are otherwise within
normal limits.

Pulsed Doppler evaluation of both ovaries demonstrates normal
appearing low-resistance arterial and venous waveforms.
IMPRESSION: 1. Single intrauterine pregnancy with gestational sac and yolk sac.
Probable embryo visualized without visible heartbeat, normal for
estimated gestational age of 5 weeks 3 days.
2. Acute or focal lesion to explain the patient's pelvic pain.
3. Normal sonographic appearance and pulsed Doppler evaluation of
both ovaries.

## 2021-01-09 ENCOUNTER — Encounter (HOSPITAL_COMMUNITY): Payer: Self-pay

## 2021-01-09 ENCOUNTER — Other Ambulatory Visit: Payer: Self-pay

## 2021-01-09 ENCOUNTER — Ambulatory Visit (HOSPITAL_COMMUNITY)
Admission: EM | Admit: 2021-01-09 | Discharge: 2021-01-09 | Disposition: A | Discharge status: Home / Self Care | Payer: Medicaid Other | Attending: Medical Oncology | Admitting: Medical Oncology

## 2021-01-09 DIAGNOSIS — H66003 Acute suppurative otitis media without spontaneous rupture of ear drum, bilateral: Secondary | ICD-10-CM

## 2021-01-09 DIAGNOSIS — Z789 Other specified health status: Secondary | ICD-10-CM

## 2021-01-09 MED ORDER — AZITHROMYCIN 250 MG PO TABS: ORAL_TABLET | ORAL | 0 refills | Status: DC

## 2021-01-09 MED ORDER — FLUTICASONE PROPIONATE 50 MCG/ACT NA SUSP: 2.0000 | Freq: Every day | NASAL | 0 refills | Status: DC

## 2021-01-09 NOTE — ED Triage Notes (Addendum)
Pt in with c/o bilateral ear pain and pus draining from ears x 1 week  Pt has been using ear drops with no relief  Pt also c/o ST and congestion

## 2021-01-09 NOTE — ED Provider Notes (Signed)
[facility]    CSN: [id number] Arrival date & time: 01/09/21  1105      History   Chief Complaint Chief Complaint  Patient presents with   Otalgia   Sore Throat    HPI Rebekah Sanders is a 20 y.o. female.   HPI  Ear Pain: Pt reports bilateral ear pain along with a sore throat and nasal congestion for the past week. She reports a purulent discharge with the ear pain. No fevers, ear trauma, significant loss of hearing but reports that hearing sounds "muffled". She has used peroxide wash and tylenol for symptoms without much relief. She reports that baby is active and moving like normal.   Past Medical History:  Diagnosis Date   ADHD (attention deficit hyperactivity disorder)    Asthma    Auditory hallucinations    Bipolar 1 disorder (HCC)    Chlamydia    Gonorrhea    Oppositional defiant disorder    Trichomonosis     Patient Active Problem List   Diagnosis Date Noted   Supervision of low-risk pregnancy 08/20/2020   Obesity in pregnancy 08/20/2020   Short interval between pregnancies affecting pregnancy in first trimester, antepartum 08/20/2020   Fall due to ice or snow 07/23/2019   Trichomoniasis 07/12/2019   Low ferritin 03/03/2018   Acne vulgaris 03/03/2018   OSA (obstructive sleep apnea) 05/26/2016   Screening for STD (sexually transmitted disease) 03/13/2013   ADHD (attention deficit hyperactivity disorder), combined type 08/08/2012    Past Surgical History:  Procedure Laterality Date   TONSILLECTOMY AND ADENOIDECTOMY Bilateral 03/2016    OB History     Gravida  2   Para  1   Term  1   Preterm      AB      Living  1      SAB      IAB      Ectopic      Multiple  0   Live Births  1            Home Medications    Prior to Admission medications   Medication Sig Start Date End Date Taking? Authorizing Provider  Prenatal Vit-Fe Fumarate-FA (PRENATAL COMPLETE) 14-0.4 MG TABS Take 1 tablet by mouth daily. 06/05/19    Elpidio Anis, PA-C    Family History Family History  Problem Relation Age of Onset   Bipolar disorder Other        Maternal grandparents   Depression Other        Parents   Drug abuse Other        multiple family members by report   Diabetes Mother     Social History Social History   Tobacco Use   Smoking status: Former    Pack years: 0.00    Types: Cigarettes, Cigars   Smokeless tobacco: Never   Tobacco comments:    father smokes in home  Vaping Use   Vaping Use: Never used  Substance Use Topics   Alcohol use: Not Currently    Comment: approx "a fifth of liquor" weekly on average   Drug use: Not Currently    Types: Marijuana    Comment: last used 06/22/20     Allergies   Penicillins   Review of Systems Review of Systems  As stated above in HPI Physical Exam Triage Vital Signs ED Triage Vitals  Enc Vitals Group     BP 01/09/21 1208 127/70     Pulse Rate 01/09/21 1207 95  Resp 01/09/21 1207 19     Temp 01/09/21 1208 98.2 F (36.8 C)     Temp Source 01/09/21 1207 Oral     SpO2 01/09/21 1207 98 %     Weight --      Height --      Head Circumference --      Peak Flow --      Pain Score 01/09/21 1206 7     Pain Loc --      Pain Edu? --      Excl. in GC? --    No data found.  Updated Vital Signs BP 127/70 (BP Location: Right Arm)   Pulse 95   Temp 98.2 F (36.8 C) (Oral)   Resp 19   LMP 05/05/2020   SpO2 98%    Physical Exam Vitals and nursing note reviewed.  Constitutional:      Appearance: She is well-developed.  HENT:     Head: Normocephalic and atraumatic.     Right Ear: Swelling present. A middle ear effusion is present. Tympanic membrane is erythematous.     Left Ear: Swelling present. A middle ear effusion is present. Tympanic membrane is erythematous.     Nose: Congestion present.     Mouth/Throat:     Mouth: Mucous membranes are dry.     Pharynx: Oropharynx is clear. Uvula midline. No oropharyngeal exudate, posterior  oropharyngeal erythema or uvula swelling.     Tonsils: No tonsillar exudate or tonsillar abscesses.  Eyes:     Conjunctiva/sclera: Conjunctivae normal.     Pupils: Pupils are equal, round, and reactive to light.  Cardiovascular:     Rate and Rhythm: Normal rate and regular rhythm.     Heart sounds: Normal heart sounds.  Pulmonary:     Effort: Pulmonary effort is normal.     Breath sounds: Normal breath sounds.  Lymphadenopathy:     Cervical: Cervical adenopathy present.  Skin:    General: Skin is warm.  Neurological:     Mental Status: She is alert.     UC Treatments / Results  Labs (all labs ordered are listed, but only abnormal results are displayed) Labs Reviewed - No data to display  EKG   Radiology No results found.  Procedures Procedures (including critical care time)  Medications Ordered in UC Medications - No data to display  Initial Impression / Assessment and Plan / UC Course  I have reviewed the triage vital signs and the nursing notes.  Pertinent labs & imaging results that were available during my care of the patient were reviewed by me and considered in my medical decision making (see chart for details).    New.  Given the length and severity of her symptoms she warrants antibiotic treatment to prevent further systemic complications especially during pregnancy.  Since she is allergic to penicillins which would be first-line therapy in pregnancy followed by doxycyline or azithromycin followed by clarithromycin.  Of these azithromycin appears to be the safest option for patient.  Discussed.  Also sending in Flonase to help with her symptoms. Red flag signs and symptoms discussed.  Final Clinical Impressions(s) / UC Diagnoses   Final diagnoses:  None   Discharge Instructions   None    ED Prescriptions   None    PDMP not reviewed this encounter.   Rushie Chestnut, [place] 01/09/21 1304

## 2021-01-12 ENCOUNTER — Other Ambulatory Visit (HOSPITAL_COMMUNITY)
Admission: RE | Admit: 2021-01-12 | Discharge: 2021-01-12 | Disposition: A | Discharge status: Home / Self Care | Payer: Medicaid Other | Source: Ambulatory Visit | Attending: Nurse Practitioner | Admitting: Nurse Practitioner

## 2021-01-12 ENCOUNTER — Other Ambulatory Visit: Payer: Self-pay

## 2021-01-12 ENCOUNTER — Ambulatory Visit (INDEPENDENT_AMBULATORY_CARE_PROVIDER_SITE_OTHER): Payer: Medicaid Other | Admitting: Nurse Practitioner

## 2021-01-12 VITALS — BP 117/80 | HR 93 | Wt 243.0 lb

## 2021-01-12 DIAGNOSIS — Z3493 Encounter for supervision of normal pregnancy, unspecified, third trimester: Secondary | ICD-10-CM

## 2021-01-12 DIAGNOSIS — Z3483 Encounter for supervision of other normal pregnancy, third trimester: Secondary | ICD-10-CM | POA: Insufficient documentation

## 2021-01-12 DIAGNOSIS — Z3A36 36 weeks gestation of pregnancy: Secondary | ICD-10-CM

## 2021-01-12 DIAGNOSIS — H6690 Otitis media, unspecified, unspecified ear: Secondary | ICD-10-CM

## 2021-01-12 NOTE — Progress Notes (Signed)
    Subjective:  Rebekah Sanders is a 20 y.o. G2P1001 at [redacted]w[redacted]d being seen today for ongoing prenatal care.  She is currently monitored for the following issues for this low-risk pregnancy and has ADHD (attention deficit hyperactivity disorder), combined type; Screening for STD (sexually transmitted disease); OSA (obstructive sleep apnea); Low ferritin; Acne vulgaris; Trichomoniasis; Fall due to ice or snow; Supervision of low-risk pregnancy; Obesity in pregnancy; and Short interval between pregnancies affecting pregnancy in first trimester, antepartum on their problem list.  Patient reports  periodic sharp stabbing pains in vagina .  Contractions: Irritability. Vag. Bleeding: None.  Movement: Present. Denies leaking of fluid.   The following portions of the patient's history were reviewed and updated as appropriate: allergies, current medications, past family history, past medical history, past social history, past surgical history and problem list. Problem list updated.  Objective:   Vitals:   01/12/21 1105  BP: 117/80  Pulse: 93  Weight: 243 lb (110.2 kg)    Fetal Status: Fetal Heart Rate (bpm): 146 Fundal Height: 34 cm Movement: Present     General:  Alert, oriented and cooperative. Patient is in no acute distress.  Skin: Skin is warm and dry. No rash noted.   Cardiovascular: Normal heart rate noted  Respiratory: Normal respiratory effort, no problems with respiration noted  Abdomen: Soft, gravid, appropriate for gestational age. Pain/Pressure: Present     Pelvic:  Cervical exam deferred       Vaginal swabs done  Extremities: Normal range of motion.  Edema: None  Mental Status: Normal mood and affect. Normal behavior. Normal judgment and thought content.   Urinalysis:      Assessment and Plan:  Pregnancy: G2P1001 at [redacted]w[redacted]d  1. Supervision of low-risk pregnancy, third trimester Doing well.  Tired today as she did not sleep well last night. Reviewed increasing pressure and  periodic stabbing pains in vagina as normal No bleeding, no contractions  2. Ear infection Went to Urgent Care this week - note reviewed.  Got antibiotics for bilateral ear infections.  States ears are improving.  3. [redacted] weeks gestation of pregnancy  - GC/Chlamydia probe amp ([place])not at [facility] - Strep Gp B Culture+Rflx  Term labor symptoms and general obstetric precautions including but not limited to vaginal bleeding, contractions, leaking of fluid and fetal movement were reviewed in detail with the patient. Please refer to After Visit Summary for other counseling recommendations.  Return in about 1 week (around 01/19/2021) for in person ROB.  Nolene Bernheim, RN, MSN, NP-BC Nurse Practitioner, [facility] for [employer], [facility] 01/12/2021 11:30 AM

## 2021-01-12 NOTE — Progress Notes (Signed)
Patient stated that she has been experiencing sharp pains in abdomen (started 2 days ago) denies any vaginal bleeding

## 2021-01-13 LAB — GC/CHLAMYDIA PROBE AMP (~~LOC~~) NOT AT ARMC
Chlamydia: NEGATIVE
Comment: NEGATIVE
Comment: NORMAL
Neisseria Gonorrhea: NEGATIVE

## 2021-01-16 LAB — STREP GP B CULTURE+RFLX: Strep Gp B Culture+Rflx: NEGATIVE

## 2021-01-20 ENCOUNTER — Ambulatory Visit (INDEPENDENT_AMBULATORY_CARE_PROVIDER_SITE_OTHER): Payer: Medicaid Other

## 2021-01-20 ENCOUNTER — Other Ambulatory Visit (HOSPITAL_COMMUNITY)
Admission: RE | Admit: 2021-01-20 | Discharge: 2021-01-20 | Disposition: A | Discharge status: Home / Self Care | Payer: Medicaid Other | Source: Ambulatory Visit

## 2021-01-20 ENCOUNTER — Other Ambulatory Visit: Payer: Self-pay

## 2021-01-20 VITALS — BP 119/77 | HR 98 | Wt 241.3 lb

## 2021-01-20 DIAGNOSIS — Z3493 Encounter for supervision of normal pregnancy, unspecified, third trimester: Secondary | ICD-10-CM

## 2021-01-20 DIAGNOSIS — N898 Other specified noninflammatory disorders of vagina: Secondary | ICD-10-CM | POA: Diagnosis present

## 2021-01-20 DIAGNOSIS — Z3A36 36 weeks gestation of pregnancy: Secondary | ICD-10-CM

## 2021-01-20 MED ORDER — TERCONAZOLE 0.4 % VA CREA
1.0000 | TOPICAL_CREAM | Freq: Every day | VAGINAL | 0 refills | Status: DC
Start: 2021-01-20 — End: 2021-02-10

## 2021-01-20 NOTE — Progress Notes (Signed)
Patient complains of painful pressure on pelvis. Pt stated that it happens when she lays down.   Prabhleen Montemayor, CMA

## 2021-01-20 NOTE — Progress Notes (Signed)
   PRENATAL VISIT NOTE  Subjective:  Rebekah Sanders is a 20 y.o. G2P1001 at [redacted]w[redacted]d being seen today for ongoing prenatal care.  She is currently monitored for the following issues for this low-risk pregnancy and has ADHD (attention deficit hyperactivity disorder), combined type; Screening for STD (sexually transmitted disease); OSA (obstructive sleep apnea); Low ferritin; Acne vulgaris; Trichomoniasis; Fall due to ice or snow; Supervision of low-risk pregnancy; Obesity in pregnancy; and Short interval between pregnancies affecting pregnancy in first trimester, antepartum on their problem list.  Patient reports pressure in vagina only when she lies down as well as vaginal itching and irritation. She denies contractions, vaginal discharge, urinary s/s, or vaginal bleeding.  Contractions: Not present. Vag. Bleeding: None.  Movement: Present. Denies leaking of fluid.   The following portions of the patient's history were reviewed and updated as appropriate: allergies, current medications, past family history, past medical history, past social history, past surgical history and problem list.   Objective:   Vitals:   01/20/21 0932  BP: 119/77  Pulse: 98  Weight: 241 lb 4.8 oz (109.5 kg)    Fetal Status: Fetal Heart Rate (bpm): 157 Fundal Height: 36 cm Movement: Present     General:  Alert, oriented and cooperative. Patient is in no acute distress.  Skin: Skin is warm and dry. No rash noted.   Cardiovascular: Normal heart rate noted  Respiratory: Normal respiratory effort, no problems with respiration noted  Abdomen: Soft, gravid, appropriate for gestational age.  Pain/Pressure: Present     Pelvic: Cervical exam performed in the presence of a chaperone Dilation: 1 Effacement (%): 50 Station: Ballotable Speculum exam with moderate amount of thick, yellow-tinged discharge. There appears to be a few small white patches noted on cervix and posterior vaginal walls c/w yeast. No lesions or masses  noted on cervix. No bleeding  Extremities: Normal range of motion.  Edema: Trace  Mental Status: Normal mood and affect. Normal behavior. Normal judgment and thought content.   Assessment and Plan:  Pregnancy: G2P1001 at [redacted]w[redacted]d  1. Supervision of low-risk pregnancy, third trimester - Routine OB care - Endorses active fetal movement - Requesting cervical exam today  2. [redacted] weeks gestation of pregnancy   3. Vaginal itching - Vaginal itching and pressure ongoing for several days - Moderate amount of thick, yellow-tinged discharge as well as few small white patches c/w yeast noted on speculum exam - Swabs obtained but given patient symptoms will treat patient for yeast infection   - terconazole (TERAZOL 7) 0.4 % vaginal cream; Place 1 applicator vaginally at bedtime. Use for seven days  Dispense: 45 g; Refill: 0 - Cervicovaginal ancillary only( [facility])    Term labor symptoms and general obstetric precautions including but not limited to vaginal bleeding, contractions, leaking of fluid and fetal movement were reviewed in detail with the patient. Please refer to After Visit Summary for other counseling recommendations.   Return in about 1 week (around 01/27/2021).   No future appointments.   Brand Males, CNM 01/20/21 10:21 AM

## 2021-01-21 ENCOUNTER — Other Ambulatory Visit: Payer: Self-pay

## 2021-01-21 ENCOUNTER — Telehealth: Payer: Self-pay | Admitting: Lactation Services

## 2021-01-21 DIAGNOSIS — O23593 Infection of other part of genital tract in pregnancy, third trimester: Secondary | ICD-10-CM

## 2021-01-21 LAB — CERVICOVAGINAL ANCILLARY ONLY
Bacterial Vaginitis (gardnerella): NEGATIVE
Candida Glabrata: NEGATIVE
Candida Vaginitis: POSITIVE — AB
Comment: NEGATIVE
Comment: NEGATIVE
Comment: NEGATIVE
Comment: NEGATIVE
Trichomonas: POSITIVE — AB

## 2021-01-21 MED ORDER — METRONIDAZOLE 500 MG PO TABS: 2000.0000 mg | ORAL_TABLET | Freq: Once | ORAL | 0 refills | Status: AC

## 2021-01-21 NOTE — Telephone Encounter (Signed)
-----   Message from Brand Males, CNM sent at 01/21/2021  1:54 PM EDT ----- Patient has trichomonas. I have already sent in the prescription to her pharmacy. I sent the patient a message through mychart but can you make sure she is aware.  Thanks! Duwayne Heck, CNM

## 2021-01-21 NOTE — Telephone Encounter (Signed)
Called patients phone number and her mother answered. LM to ask patient to call the office at her convenience. Patients mother voiced understanding.

## 2021-01-22 ENCOUNTER — Encounter (HOSPITAL_COMMUNITY): Payer: Self-pay | Admitting: Obstetrics & Gynecology

## 2021-01-22 ENCOUNTER — Other Ambulatory Visit: Payer: Self-pay

## 2021-01-22 ENCOUNTER — Inpatient Hospital Stay (HOSPITAL_COMMUNITY)
Admission: AD | Admit: 2021-01-22 | Discharge: 2021-01-22 | Disposition: A | Discharge status: Home / Self Care | Payer: Medicaid Other | Attending: Obstetrics & Gynecology | Admitting: Obstetrics & Gynecology

## 2021-01-22 DIAGNOSIS — Z3689 Encounter for other specified antenatal screening: Secondary | ICD-10-CM | POA: Insufficient documentation

## 2021-01-22 DIAGNOSIS — O471 False labor at or after 37 completed weeks of gestation: Secondary | ICD-10-CM

## 2021-01-22 DIAGNOSIS — Z3A37 37 weeks gestation of pregnancy: Secondary | ICD-10-CM

## 2021-01-22 DIAGNOSIS — O479 False labor, unspecified: Secondary | ICD-10-CM

## 2021-01-22 DIAGNOSIS — O99210 Obesity complicating pregnancy, unspecified trimester: Secondary | ICD-10-CM

## 2021-01-22 MED ORDER — ACETAMINOPHEN 500 MG PO TABS
1000.0000 mg | ORAL_TABLET | Freq: Once | ORAL | Status: AC
  Administered 2021-01-22: 1000 mg via ORAL
  Filled 2021-01-22: qty 2

## 2021-01-22 NOTE — MAU Note (Signed)
Pt reports cts since 10pm. Denies any vag bleeding or lleaking. Good fetal movement. Monday 1.5cm dilated.

## 2021-01-22 NOTE — Telephone Encounter (Signed)
Called pt unable to leave a VM 

## 2021-01-22 NOTE — MAU Provider Note (Signed)
S: [name] is a 20 y.o. G2P1001 at [redacted]w[redacted]d  who presents to MAU today for labor evaluation.     Cervical exam by RN:  Dilation: 2.5 Effacement (%): 60 Cervical Position: Posterior Station: -3 Presentation: Undeterminable Exam by:: Varney Baas, RN  Fetal Monitoring: Baseline: 135 Variability: Moderate Accelerations: Present 15x15 Decelerations: None Contractions: Irregular  MDM Discussed patient with RN. NST reviewed.   A: SIUP at [redacted]w[redacted]d  False labor  P: Discharge home Labor precautions and kick counts included in AVS Patient to follow-up with primary ob as scheduled  Patient may return to MAU as needed or when in labor   Gerrit Heck, [place] 01/22/2021 6:57 AM

## 2021-01-25 ENCOUNTER — Inpatient Hospital Stay (HOSPITAL_COMMUNITY)
Admission: AD | Admit: 2021-01-25 | Discharge: 2021-01-25 | Disposition: A | Discharge status: Home / Self Care | Payer: Medicaid Other | Attending: Obstetrics and Gynecology | Admitting: Obstetrics and Gynecology

## 2021-01-25 ENCOUNTER — Other Ambulatory Visit: Payer: Self-pay

## 2021-01-25 ENCOUNTER — Encounter (HOSPITAL_COMMUNITY): Payer: Self-pay | Admitting: Obstetrics and Gynecology

## 2021-01-25 DIAGNOSIS — Z3A37 37 weeks gestation of pregnancy: Secondary | ICD-10-CM | POA: Diagnosis not present

## 2021-01-25 DIAGNOSIS — O98513 Other viral diseases complicating pregnancy, third trimester: Secondary | ICD-10-CM | POA: Diagnosis not present

## 2021-01-25 DIAGNOSIS — Z3493 Encounter for supervision of normal pregnancy, unspecified, third trimester: Secondary | ICD-10-CM

## 2021-01-25 DIAGNOSIS — O471 False labor at or after 37 completed weeks of gestation: Secondary | ICD-10-CM | POA: Insufficient documentation

## 2021-01-25 DIAGNOSIS — A599 Trichomoniasis, unspecified: Secondary | ICD-10-CM

## 2021-01-25 DIAGNOSIS — O479 False labor, unspecified: Secondary | ICD-10-CM

## 2021-01-25 DIAGNOSIS — O99210 Obesity complicating pregnancy, unspecified trimester: Secondary | ICD-10-CM

## 2021-01-25 MED ORDER — METRONIDAZOLE 500 MG PO TABS
2000.0000 mg | ORAL_TABLET | Freq: Once | ORAL | Status: AC
  Administered 2021-01-25: 2000 mg via ORAL
  Filled 2021-01-25: qty 4

## 2021-01-25 NOTE — MAU Note (Signed)
Been having contractions pretty regular since 3. No bleeding.  Does feel a little wetness, but hasn't gone to the bathroom to check. +FM.

## 2021-01-25 NOTE — MAU Provider Note (Signed)
Event Date/Time   First Provider Initiated Contact with Patient 01/25/21 1809       S: [name] is a 20 y.o. G2P1001 at [redacted]w[redacted]d  who presents to MAU today complaining irregular contractions since this morning. She denies vaginal bleeding. She denies LOF. She reports normal fetal movement.  She has had some itching and thought maybe she had a yeast infection. She has a new phone so she hasn't gotten the calls or mychart message from clinic about + trich test from last visit.   O: BP 126/72 (BP Location: Right Arm)   Pulse 79   Temp 97.7 F (36.5 C) (Oral)   Resp 17   Ht 5\' 7"  (1.702 m)   Wt 109.7 kg   LMP 05/05/2020   SpO2 100%   BMI 37.89 kg/m  GENERAL: Well-developed, well-nourished female in no acute distress.  HEAD: Normocephalic, atraumatic.  CHEST: Normal effort of breathing, regular heart rate ABDOMEN: Soft, nontender, gravid  Cervical exam:      Fetal Monitoring: Baseline: 140 Variability: moderate Accelerations: 15x15 Decelerations: none Contractions: rare   A: SIUP at [redacted]w[redacted]d  False labor Trichomoniasis   P: Patient treated with 2g flagyl here in MAU prior to DC home Discussed partner treatment  Labor precautions   [redacted]w[redacted]d DNP, CNM  01/25/21  6:23 PM

## 2021-01-28 ENCOUNTER — Ambulatory Visit (INDEPENDENT_AMBULATORY_CARE_PROVIDER_SITE_OTHER): Payer: Medicaid Other | Admitting: Family Medicine

## 2021-01-28 ENCOUNTER — Other Ambulatory Visit: Payer: Self-pay

## 2021-01-28 VITALS — BP 106/76 | HR 104 | Wt 238.4 lb

## 2021-01-28 DIAGNOSIS — A599 Trichomoniasis, unspecified: Secondary | ICD-10-CM

## 2021-01-28 DIAGNOSIS — O09891 Supervision of other high risk pregnancies, first trimester: Secondary | ICD-10-CM

## 2021-01-28 DIAGNOSIS — O99210 Obesity complicating pregnancy, unspecified trimester: Secondary | ICD-10-CM

## 2021-01-28 DIAGNOSIS — Z3493 Encounter for supervision of normal pregnancy, unspecified, third trimester: Secondary | ICD-10-CM

## 2021-01-28 NOTE — Progress Notes (Signed)
   Subjective:  Rebekah Sanders is a 20 y.o. G2P1001 at [redacted]w[redacted]d being seen today for ongoing prenatal care.  She is currently monitored for the following issues for this low-risk pregnancy and has ADHD (attention deficit hyperactivity disorder), combined type; Screening for STD (sexually transmitted disease); OSA (obstructive sleep apnea); Low ferritin; Acne vulgaris; Trichomoniasis; Fall due to ice or snow; Supervision of low-risk pregnancy; Obesity in pregnancy; and Short interval between pregnancies affecting pregnancy in first trimester, antepartum on their problem list.  Patient reports occasional contractions.  Contractions: Irritability. Vag. Bleeding: Large.  Movement: Present. Denies leaking of fluid.   The following portions of the patient's history were reviewed and updated as appropriate: allergies, current medications, past family history, past medical history, past social history, past surgical history and problem list. Problem list updated.  Objective:   Vitals:   01/28/21 1526  BP: 106/76  Pulse: (!) 104  Weight: 238 lb 6.4 oz (108.1 kg)    Fetal Status: Fetal Heart Rate (bpm): 163   Movement: Present     General:  Alert, oriented and cooperative. Patient is in no acute distress.  Skin: Skin is warm and dry. No rash noted.   Cardiovascular: Normal heart rate noted  Respiratory: Normal respiratory effort, no problems with respiration noted  Abdomen: Soft, gravid, appropriate for gestational age. Pain/Pressure: Present     Pelvic: Vag. Bleeding: Large Vag D/C Character: White   Cervical exam deferred Dilation: 1.5 Effacement (%): 50 Station: Ballotable  Extremities: Normal range of motion.  Edema: None  Mental Status: Normal mood and affect. Normal behavior. Normal judgment and thought content.   Urinalysis:      Assessment and Plan:  Pregnancy: G2P1001 at [redacted]w[redacted]d  1. Encounter for supervision of low-risk pregnancy in third trimester BP and FHR normal Discuss IOL at  next visit  2. Short interval between pregnancies affecting pregnancy in first trimester, antepartum Last delivery 01/2020  3. Obesity in pregnancy   4. Trichomoniasis Treated in MAU on 01/25/2021, will need TOC in 3-4 weeks  Term labor symptoms and general obstetric precautions including but not limited to vaginal bleeding, contractions, leaking of fluid and fetal movement were reviewed in detail with the patient. Please refer to After Visit Summary for other counseling recommendations.  Return in 1 week (on 02/04/2021).   Venora Maples, MD

## 2021-01-28 NOTE — Patient Instructions (Signed)

## 2021-01-29 ENCOUNTER — Encounter (HOSPITAL_COMMUNITY): Payer: Self-pay | Admitting: Obstetrics and Gynecology

## 2021-01-29 ENCOUNTER — Inpatient Hospital Stay (HOSPITAL_COMMUNITY)
Admission: AD | Admit: 2021-01-29 | Discharge: 2021-01-29 | Disposition: A | Discharge status: Home / Self Care | Payer: Medicaid Other | Attending: Obstetrics and Gynecology | Admitting: Obstetrics and Gynecology

## 2021-01-29 DIAGNOSIS — O26893 Other specified pregnancy related conditions, third trimester: Secondary | ICD-10-CM | POA: Insufficient documentation

## 2021-01-29 DIAGNOSIS — B373 Candidiasis of vulva and vagina: Secondary | ICD-10-CM | POA: Diagnosis not present

## 2021-01-29 DIAGNOSIS — A599 Trichomoniasis, unspecified: Secondary | ICD-10-CM | POA: Insufficient documentation

## 2021-01-29 DIAGNOSIS — Z0371 Encounter for suspected problem with amniotic cavity and membrane ruled out: Secondary | ICD-10-CM | POA: Diagnosis not present

## 2021-01-29 DIAGNOSIS — O99210 Obesity complicating pregnancy, unspecified trimester: Secondary | ICD-10-CM

## 2021-01-29 DIAGNOSIS — Z3A38 38 weeks gestation of pregnancy: Secondary | ICD-10-CM | POA: Insufficient documentation

## 2021-01-29 DIAGNOSIS — Z3493 Encounter for supervision of normal pregnancy, unspecified, third trimester: Secondary | ICD-10-CM

## 2021-01-29 DIAGNOSIS — B3731 Acute candidiasis of vulva and vagina: Secondary | ICD-10-CM

## 2021-01-29 LAB — POCT FERN TEST: POCT Fern Test: NEGATIVE

## 2021-01-29 NOTE — MAU Note (Signed)
Wetness noted, fern neg

## 2021-01-29 NOTE — MAU Note (Signed)
Pt's mother noted her pants were wet.  Went to the bathroom, note clear THICK d/c.  Some wet ness since.

## 2021-01-29 NOTE — MAU Note (Signed)
?   Leaking started at 1100. Put on a panty liner, had a little more, none since arrived.  Denies pain or bleeding.

## 2021-01-29 NOTE — MAU Provider Note (Signed)
Event Date/Time   First Provider Initiated Contact with Patient 01/29/21 1435     S: [name] is a 20 y.o. G2P1001 at [redacted]w[redacted]d  who presents to MAU today complaining of leaking of fluid since 1100. She denies vaginal bleeding. She denies contractions. She reports normal fetal movement.    O: BP 110/68 (BP Location: Right Arm)   Pulse (!) 103   Temp 98.1 F (36.7 C) (Oral)   Resp 16   LMP 05/05/2020   SpO2 99%  GENERAL: Well-developed, well-nourished female in no acute distress.  HEAD: Normocephalic, atraumatic.  CHEST: Normal effort of breathing, regular heart rate ABDOMEN: Soft, nontender, gravid PELVIC: Normal external female genitalia. Vagina is pink and rugated. Cervix with normal contour, no lesions. Small amount of thick white discharge.  Negative pooling.   Cervical exam: Deferred  Fetal Monitoring: Baseline: 140 bpm Variability: moderate Accelerations: 15 x 15 Decelerations: none Contractions: none  Results for orders placed or performed during the hospital encounter of 01/29/21 (from the past 24 hour(s))  Fern Test     Status: None   Collection Time: 01/29/21  2:13 PM  Result Value Ref Range   POCT Fern Test Negative = intact amniotic membranes      A: SIUP at [redacted]w[redacted]d  Membranes intact  P: Discharge home Labor precautions discussed Patient encouraged to fill and complete yeast and trichomonas treatment as soon as possible  Patient advised to follow-up with [facility] as scheduled next week for routine prenatal care Patient may return to MAU as needed or if her condition were to change or worsen  Marny Lowenstein, PA-C 01/29/2021 2:35 PM

## 2021-02-05 ENCOUNTER — Ambulatory Visit (INDEPENDENT_AMBULATORY_CARE_PROVIDER_SITE_OTHER): Payer: Medicaid Other | Admitting: Obstetrics & Gynecology

## 2021-02-05 ENCOUNTER — Other Ambulatory Visit: Payer: Self-pay

## 2021-02-05 ENCOUNTER — Telehealth (HOSPITAL_COMMUNITY): Payer: Self-pay | Admitting: *Deleted

## 2021-02-05 VITALS — BP 127/70 | HR 99 | Wt 239.6 lb

## 2021-02-05 DIAGNOSIS — Z3A39 39 weeks gestation of pregnancy: Secondary | ICD-10-CM

## 2021-02-05 DIAGNOSIS — Z3493 Encounter for supervision of normal pregnancy, unspecified, third trimester: Secondary | ICD-10-CM

## 2021-02-05 NOTE — Progress Notes (Signed)
   PRENATAL VISIT NOTE  Subjective:  Rebekah Sanders is a 20 y.o. G2P1001 at [redacted]w[redacted]d being seen today for ongoing prenatal care.  She is currently monitored for the following issues for this low-risk pregnancy and has ADHD (attention deficit hyperactivity disorder), combined type; Screening for STD (sexually transmitted disease); OSA (obstructive sleep apnea); Low ferritin; Acne vulgaris; Trichomoniasis; Fall due to ice or snow; Supervision of low-risk pregnancy; Obesity in pregnancy; and Short interval between pregnancies affecting pregnancy in first trimester, antepartum on their problem list.  Patient reports  markedly increased pelvic pressure.  Unable to sleep or rest. Desires induction if not delivered by her due date .  Contractions: Irritability. Vag. Bleeding: None.  Movement: Present. Denies leaking of fluid.   The following portions of the patient's history were reviewed and updated as appropriate: allergies, current medications, past family history, past medical history, past social history, past surgical history and problem list.   Objective:   Vitals:   02/05/21 0832  BP: 127/70  Pulse: 99  Weight: 239 lb 9.6 oz (108.7 kg)    Fetal Status: Fetal Heart Rate (bpm): 143 Fundal Height: 38 cm Movement: Present  Presentation: Vertex  General:  Alert, oriented and cooperative. Patient is in no acute distress.  Skin: Skin is warm and dry. No rash noted.   Cardiovascular: Normal heart rate noted  Respiratory: Normal respiratory effort, no problems with respiration noted  Abdomen: Soft, gravid, appropriate for gestational age.  Pain/Pressure: Present     Pelvic: Cervical exam performed in the presence of a chaperone Dilation: 4 Effacement (%): 50 Station: -2  Extremities: Normal range of motion.  Edema: None  Mental Status: Normal mood and affect. Normal behavior. Normal judgment and thought content.   Assessment and Plan:  Pregnancy: G2P1001 at [redacted]w[redacted]d 1. [redacted] weeks gestation of  pregnancy 2. Encounter for supervision of low-risk pregnancy in third trimester Very favorable cervix.  IOL scheduled for midnight on 02/09/21 as requested, orders placed. Term labor symptoms and general obstetric precautions including but not limited to vaginal bleeding, contractions, leaking of fluid and fetal movement were reviewed in detail with the patient. Please refer to After Visit Summary for other counseling recommendations.   Return for Postpartum check.  Future Appointments  Date Time Provider Department Center  02/09/2021 12:00 AM [facility] SCHED ROOM [facility] None    Jaynie Collins, MD

## 2021-02-05 NOTE — Telephone Encounter (Signed)
Preadmission screen  

## 2021-02-05 NOTE — Patient Instructions (Addendum)
Return to office for any scheduled appointments. Call the office or go to the MAU at [facility] if: You begin to have strong, frequent contractions Your water breaks.  Sometimes it is a big gush of fluid, sometimes it is just a trickle that keeps getting your panties wet or running down your legs You have vaginal bleeding.  It is normal to have a small amount of spotting if your cervix was checked.  You do not feel your baby moving like normal.  If you do not, get something to eat and drink and lay down and focus on feeling your baby move.   If your baby is still not moving like normal, you should call the office or go to MAU. Any other obstetric concerns. Labor Induction Labor induction is when steps are taken to cause a pregnant woman to begin the labor process. Most women go into labor on their own between 37 weeks and 42 weeks of pregnancy. When this does not happen, or when there is a medical needfor labor to begin, steps may be taken to induce, or bring on, labor. Labor induction causes a pregnant woman's uterus to contract. It also causes the cervix to soften (ripen), open (dilate), and thin out. Usually, labor is not induced before 39 weeks of pregnancyunless there is a medical reason to do so. When is labor induction considered? Labor induction may be right for you if: Your pregnancy lasts longer than 41 to 42 weeks. Your placenta is separating from your uterus (placental abruption). You have a rupture of membranes and your labor does not begin. You have health problems, like diabetes or high blood pressure (preeclampsia) during your pregnancy. Your baby has stopped growing or does not have enough amniotic fluid. Before labor induction begins, your health care provider will consider the following factors: Your medical condition and the baby's condition. How many weeks you have been pregnant. How mature the baby's lungs are. The condition of your  cervix. The position of the baby. The size of your birth canal. Tell a health care provider about: Any allergies you have. All medicines you are taking, including vitamins, herbs, eye drops, creams, and over-the-counter medicines. Any problems you or your family members have had with anesthetic medicines. Any surgeries you have had. Any blood disorders you have. Any medical conditions you have. What are the risks? Generally, this is a safe procedure. However, problems may occur, including: Failed induction. Changes in fetal heart rate, such as being too high, too low, or irregular (erratic). Infection in the mother or the baby. Increased risk of having a cesarean delivery. Breaking off (abruption) of the placenta from the uterus. This is rare. Rupture of the uterus. This is very rare. Your baby could fail to get enough blood flow or oxygen. This can be life-threatening. When induction is needed for medical reasons, the benefits generally outweighthe risks. What happens during the procedure? During the procedure, your health care provider will use one of these methods to induce labor: Stripping the membranes. In this method, the amniotic sac tissue is gently separated from the cervix. This causes the following to happen: Your cervix stretches, which in turn causes the release of prostaglandins. Prostaglandins induce labor and cause the uterus to contract. This procedure is often done in an office visit. You will be sent home to wait for contractions to begin. Prostaglandin medicine. This medicine starts contractions and causes the cervix to dilate and ripen. This can be taken by   mouth (orally) or by being inserted into the vagina (suppository). Inserting a small, thin tube (catheter) with a balloon into the vagina and then expanding the balloon with water to dilate the cervix. Breaking the water. In this method, a small instrument is used to make a small hole in the amniotic sac. This  eventually causes the amniotic sac to break. Contractions should begin within a few hours. Medicine to trigger or strengthen contractions. This medicine is given through an IV that is inserted into a vein in your arm. This procedure may vary among health care providers and hospitals. Where to find more information March of [employer]: www.marchofdimes.org The American College of Obstetricians and Gynecologists: www.acog.org Summary Labor induction causes a pregnant woman's uterus to contract. It also causes the cervix to soften (ripen), open (dilate), and thin out. Labor is usually not induced before 39 weeks of pregnancy unless there is a medical reason to do so. When induction is needed for medical reasons, the benefits generally outweigh the risks. Talk with your health care provider about which methods of labor induction are right for you. This information is not intended to replace advice given to you by your health care provider. Make sure you discuss any questions you have with your healthcare provider. Document Revised: 04/24/2020 Document Reviewed: 04/24/2020 Elsevier Patient Education  2022 [employer]  

## 2021-02-06 ENCOUNTER — Telehealth (HOSPITAL_COMMUNITY): Payer: Self-pay | Admitting: *Deleted

## 2021-02-06 NOTE — Telephone Encounter (Signed)
Preadmission screen  

## 2021-02-07 ENCOUNTER — Other Ambulatory Visit: Payer: Self-pay | Admitting: Advanced Practice Midwife

## 2021-02-09 ENCOUNTER — Inpatient Hospital Stay (HOSPITAL_COMMUNITY): Payer: Medicaid Other

## 2021-02-09 ENCOUNTER — Other Ambulatory Visit: Payer: Self-pay

## 2021-02-09 ENCOUNTER — Inpatient Hospital Stay (HOSPITAL_COMMUNITY)
Admission: AD | Admit: 2021-02-09 | Discharge: 2021-02-10 | DRG: 807 | Disposition: A | Discharge status: Home / Self Care | Payer: Medicaid Other | Attending: Family Medicine | Admitting: Family Medicine

## 2021-02-09 ENCOUNTER — Encounter (HOSPITAL_COMMUNITY): Payer: Self-pay | Admitting: Obstetrics & Gynecology

## 2021-02-09 DIAGNOSIS — Z30017 Encounter for initial prescription of implantable subdermal contraceptive: Secondary | ICD-10-CM

## 2021-02-09 DIAGNOSIS — Z20822 Contact with and (suspected) exposure to covid-19: Secondary | ICD-10-CM | POA: Diagnosis present

## 2021-02-09 DIAGNOSIS — O99214 Obesity complicating childbirth: Secondary | ICD-10-CM | POA: Diagnosis present

## 2021-02-09 DIAGNOSIS — O480 Post-term pregnancy: Principal | ICD-10-CM | POA: Diagnosis present

## 2021-02-09 DIAGNOSIS — O26893 Other specified pregnancy related conditions, third trimester: Secondary | ICD-10-CM | POA: Diagnosis present

## 2021-02-09 DIAGNOSIS — Z3A40 40 weeks gestation of pregnancy: Secondary | ICD-10-CM

## 2021-02-09 DIAGNOSIS — Z87891 Personal history of nicotine dependence: Secondary | ICD-10-CM

## 2021-02-09 LAB — CBC
HCT: 34.8 % — ABNORMAL LOW (ref 36.0–46.0)
Hemoglobin: 11.6 g/dL — ABNORMAL LOW (ref 12.0–15.0)
MCH: 30.7 pg (ref 26.0–34.0)
MCHC: 33.3 g/dL (ref 30.0–36.0)
MCV: 92.1 fL (ref 80.0–100.0)
Platelets: 273 10*3/uL (ref 150–400)
RBC: 3.78 MIL/uL — ABNORMAL LOW (ref 3.87–5.11)
RDW: 15 % (ref 11.5–15.5)
WBC: 8.9 10*3/uL (ref 4.0–10.5)
nRBC: 0 % (ref 0.0–0.2)

## 2021-02-09 LAB — RPR: RPR Ser Ql: NONREACTIVE

## 2021-02-09 LAB — RESP PANEL BY RT-PCR (FLU A&B, COVID) ARPGX2
Influenza A by PCR: NEGATIVE
Influenza B by PCR: NEGATIVE
SARS Coronavirus 2 by RT PCR: NEGATIVE

## 2021-02-09 LAB — TYPE AND SCREEN
ABO/RH(D): A POS
Antibody Screen: NEGATIVE

## 2021-02-09 MED ORDER — PRENATAL MULTIVITAMIN CH
1.0000 | ORAL_TABLET | Freq: Every day | ORAL | Status: DC
  Administered 2021-02-10: 1 via ORAL
  Filled 2021-02-09: qty 1

## 2021-02-09 MED ORDER — LACTATED RINGERS IV SOLN: 500.0000 mL | INTRAVENOUS | Status: DC | PRN

## 2021-02-09 MED ORDER — OXYCODONE-ACETAMINOPHEN 5-325 MG PO TABS: 1.0000 | ORAL_TABLET | ORAL | Status: DC | PRN

## 2021-02-09 MED ORDER — LIDOCAINE HCL (PF) 1 % IJ SOLN
INTRAMUSCULAR | Status: AC
  Filled 2021-02-09: qty 30

## 2021-02-09 MED ORDER — DIPHENHYDRAMINE HCL 25 MG PO CAPS
25.0000 mg | ORAL_CAPSULE | Freq: Four times a day (QID) | ORAL | Status: DC | PRN
Start: 2021-02-09 — End: 2021-02-10

## 2021-02-09 MED ORDER — LIDOCAINE HCL (PF) 1 % IJ SOLN
30.0000 mL | INTRAMUSCULAR | Status: AC | PRN
  Administered 2021-02-09: 30 mL via SUBCUTANEOUS
  Filled 2021-02-09: qty 30

## 2021-02-09 MED ORDER — ONDANSETRON HCL 4 MG/2ML IJ SOLN: 4.0000 mg | INTRAMUSCULAR | Status: DC | PRN

## 2021-02-09 MED ORDER — ACETAMINOPHEN 325 MG PO TABS: 650.0000 mg | ORAL_TABLET | ORAL | Status: DC | PRN

## 2021-02-09 MED ORDER — BENZOCAINE-MENTHOL 20-0.5 % EX AERO: 1.0000 "application " | INHALATION_SPRAY | CUTANEOUS | Status: DC | PRN

## 2021-02-09 MED ORDER — ONDANSETRON HCL 4 MG PO TABS: 4.0000 mg | ORAL_TABLET | ORAL | Status: DC | PRN

## 2021-02-09 MED ORDER — SENNOSIDES-DOCUSATE SODIUM 8.6-50 MG PO TABS
2.0000 | ORAL_TABLET | Freq: Every day | ORAL | Status: DC
  Administered 2021-02-10: 2 via ORAL
  Filled 2021-02-09: qty 2

## 2021-02-09 MED ORDER — LACTATED RINGERS IV SOLN: INTRAVENOUS | Status: DC

## 2021-02-09 MED ORDER — OXYTOCIN-SODIUM CHLORIDE 30-0.9 UT/500ML-% IV SOLN
1.0000 m[IU]/min | INTRAVENOUS | Status: DC
Start: 2021-02-09 — End: 2021-02-09
  Administered 2021-02-09: 2 m[IU]/min via INTRAVENOUS
  Filled 2021-02-09: qty 500

## 2021-02-09 MED ORDER — OXYCODONE-ACETAMINOPHEN 5-325 MG PO TABS
2.0000 | ORAL_TABLET | ORAL | Status: DC | PRN
Start: 2021-02-09 — End: 2021-02-09

## 2021-02-09 MED ORDER — FENTANYL CITRATE (PF) 100 MCG/2ML IJ SOLN
50.0000 ug | INTRAMUSCULAR | Status: DC | PRN
  Filled 2021-02-09: qty 2

## 2021-02-09 MED ORDER — ONDANSETRON HCL 4 MG/2ML IJ SOLN: 4.0000 mg | Freq: Four times a day (QID) | INTRAMUSCULAR | Status: DC | PRN

## 2021-02-09 MED ORDER — IBUPROFEN 600 MG PO TABS
600.0000 mg | ORAL_TABLET | Freq: Four times a day (QID) | ORAL | Status: DC
  Administered 2021-02-09 – 2021-02-10 (×4): 600 mg via ORAL
  Filled 2021-02-09 (×4): qty 1

## 2021-02-09 MED ORDER — ZOLPIDEM TARTRATE 5 MG PO TABS: 5.0000 mg | ORAL_TABLET | Freq: Every evening | ORAL | Status: DC | PRN

## 2021-02-09 MED ORDER — WITCH HAZEL-GLYCERIN EX PADS: 1.0000 "application " | MEDICATED_PAD | CUTANEOUS | Status: DC | PRN

## 2021-02-09 MED ORDER — OXYTOCIN BOLUS FROM INFUSION
333.0000 mL | Freq: Once | INTRAVENOUS | Status: AC
  Administered 2021-02-09: 333 mL via INTRAVENOUS

## 2021-02-09 MED ORDER — TETANUS-DIPHTH-ACELL PERTUSSIS 5-2.5-18.5 LF-MCG/0.5 IM SUSY
0.5000 mL | PREFILLED_SYRINGE | Freq: Once | INTRAMUSCULAR | Status: DC
Start: 2021-02-10 — End: 2021-02-10

## 2021-02-09 MED ORDER — OXYTOCIN-SODIUM CHLORIDE 30-0.9 UT/500ML-% IV SOLN
2.5000 [IU]/h | INTRAVENOUS | Status: DC
  Administered 2021-02-09: 2.5 [IU]/h via INTRAVENOUS

## 2021-02-09 MED ORDER — MISOPROSTOL 25 MCG QUARTER TABLET: 25.0000 ug | ORAL_TABLET | ORAL | Status: DC | PRN

## 2021-02-09 MED ORDER — SIMETHICONE 80 MG PO CHEW: 80.0000 mg | CHEWABLE_TABLET | ORAL | Status: DC | PRN

## 2021-02-09 MED ORDER — FLEET ENEMA 7-19 GM/118ML RE ENEM: 1.0000 | ENEMA | Freq: Every day | RECTAL | Status: DC | PRN

## 2021-02-09 MED ORDER — HYDROXYZINE HCL 50 MG PO TABS: 50.0000 mg | ORAL_TABLET | Freq: Four times a day (QID) | ORAL | Status: DC | PRN

## 2021-02-09 MED ORDER — SOD CITRATE-CITRIC ACID 500-334 MG/5ML PO SOLN: 30.0000 mL | ORAL | Status: DC | PRN

## 2021-02-09 MED ORDER — COCONUT OIL OIL: 1.0000 "application " | TOPICAL_OIL | Status: DC | PRN

## 2021-02-09 MED ORDER — TERBUTALINE SULFATE 1 MG/ML IJ SOLN: 0.2500 mg | Freq: Once | INTRAMUSCULAR | Status: DC | PRN

## 2021-02-09 MED ORDER — DIBUCAINE (PERIANAL) 1 % EX OINT: 1.0000 "application " | TOPICAL_OINTMENT | CUTANEOUS | Status: DC | PRN

## 2021-02-09 NOTE — H&P (Addendum)
OBSTETRIC ADMISSION HISTORY AND PHYSICAL  Rebekah Sanders is a 20 y.o. female G2P1001 with IUP at [redacted]w[redacted]d by LMP presenting for IOL-elective. She reports +FMs, No LOF, no VB, no blurry vision, headaches or peripheral edema, and RUQ pain.  She plans on breast feeding. She request Nexplanon for birth control. She received her prenatal care at [facility]. Pt is doing well without complaints, not contracting regularly.   Dating: By LMP --->  Estimated Date of Delivery: 02/09/21  Sono:   11/24/20@[redacted]w[redacted]d , CWD, normal anatomy, cephalic presentation, posterior placental lie, 1319g, 37% EFW  Prenatal History/Complications:  Asthma (childhood, no hospitalizations) Bipolar 1 disorder Hx trichomonas (treated 7/7 in [facility])  Past Medical History: Past Medical History:  Diagnosis Date   ADHD (attention deficit hyperactivity disorder)    Asthma    Auditory hallucinations    Bipolar 1 disorder (HCC)    Chlamydia    Gonorrhea    Oppositional defiant disorder    Trichomonosis     Past Surgical History: Past Surgical History:  Procedure Laterality Date   TONSILLECTOMY AND ADENOIDECTOMY Bilateral 03/2016    Obstetrical History: OB History     Gravida  2   Para  1   Term  1   Preterm      AB      Living  1      SAB      IAB      Ectopic      Multiple  0   Live Births  1           Social History Social History   Socioeconomic History   Marital status: Significant Other    Spouse name: Not on file   Number of children: 0   Years of education: Not on file   Highest education level: Not on file  Occupational History   Not on file  Tobacco Use   Smoking status: Former    Types: Cigarettes, Cigars   Smokeless tobacco: Never   Tobacco comments:    father smokes in home  Vaping Use   Vaping Use: Never used  Substance and Sexual Activity   Alcohol use: Not Currently    Comment: approx "a fifth of liquor" weekly on average   Drug use: Not Currently    Types:  Marijuana    Comment: last used 06/22/20   Sexual activity: Yes    Partners: Male    Birth control/protection: None    Comment: had protected sex 2 days ago, but the condom fell off. 05/27/16  protected last weekend. 4/11/18unprocted  3 weeks ago   Other Topics Concern   Not on file  Social History Narrative   Not on file   Social Determinants of Health   Financial Resource Strain: Not on file  Food Insecurity: No Food Insecurity   Worried About Programme researcher, broadcasting/film/video in the Last Year: Never true   Ran Out of Food in the Last Year: Never true  Transportation Needs: No Transportation Needs   Lack of Transportation (Medical): No   Lack of Transportation (Non-Medical): No  Physical Activity: Not on file  Stress: Not on file  Social Connections: Not on file    Family History: Family History  Problem Relation Age of Onset   Diabetes Mother    Healthy Father    Bipolar disorder Other        Maternal grandparents   Depression Other        Parents   Drug abuse Other  multiple family members by report    Allergies: Allergies  Allergen Reactions   Penicillins Hives    Medications Prior to Admission  Medication Sig Dispense Refill Last Dose   Prenatal Vit-Fe Fumarate-FA (PRENATAL COMPLETE) 14-0.4 MG TABS Take 1 tablet by mouth daily. 60 tablet 0    terconazole (TERAZOL 7) 0.4 % vaginal cream Place 1 applicator vaginally at bedtime. Use for seven days 45 g 0      Review of Systems   All systems reviewed and negative except as stated in HPI  Blood pressure 112/71, pulse 79, temperature 97.7 F (36.5 C), temperature source Oral, resp. rate 17, height 5\' 7"  (1.702 m), weight 110.5 kg, last menstrual period 05/05/2020, SpO2 97 %, unknown if currently breastfeeding. General appearance: alert, cooperative, and appears stated age Lungs: Normal respiratory effort  Abdomen: soft, non-tender; bowel sounds normal Pelvic: As stated below Extremities: Homans sign is negative, no  sign of DVT Presentation: cephalic U/S Fetal monitoringBaseline: 130 bpm, Variability: Good {> 6 bpm), Accelerations: Reactive, and Decelerations: Absent Uterine activity: Intermittent    Prenatal labs: ABO, Rh: --/--/A POS (03/01 1757) Antibody: NEG (03/01 1757) Rubella: 1.66 (01/26 1236) RPR: Non Reactive (05/05 0841)  HBsAg: Negative (01/26 1236)  HIV: Non Reactive (05/05 0841)  GBS: Negative/-- (06/20 1332)  1 hr Glucola passed Genetic screening  nml Anatomy 12-31-1972 nml  Prenatal Transfer Tool  Maternal Diabetes: No Genetic Screening: Normal Maternal Ultrasounds/Referrals: Normal Fetal Ultrasounds or other Referrals:  None Maternal Substance Abuse:  No Significant Maternal Medications:  None Significant Maternal Lab Results: None and Group B Strep negative  No results found for this or any previous visit (from the past 24 hour(s)).  Patient Active Problem List   Diagnosis Date Noted   Post term pregnancy over 40 weeks 02/09/2021   Supervision of low-risk pregnancy 08/20/2020   Obesity in pregnancy 08/20/2020   Short interval between pregnancies affecting pregnancy in first trimester, antepartum 08/20/2020   Fall due to ice or snow 07/23/2019   Trichomoniasis 07/12/2019   Low ferritin 03/03/2018   Acne vulgaris 03/03/2018   OSA (obstructive sleep apnea) 05/26/2016   Screening for STD (sexually transmitted disease) 03/13/2013   ADHD (attention deficit hyperactivity disorder), combined type 08/08/2012    Assessment/Plan:  Rebekah Sanders is a 20 y.o. G2P1001 at [redacted]w[redacted]d here for elective IOL.  #IOL: With pt being 4cm dilated, can start with pitocin 2/2, monitor and titrate as tolerated.  #Pain: prn #FWB: Cat 1 #ID: GBS neg #MOF: breast #MOC: Nexplanon IP  [redacted]w[redacted]d, MD, PGY1 02/09/2021, 6:08 AM   Attestation of Supervision of Student:  I confirm that I have verified the information documented in the resident's note and that I have also personally reperformed  the history, physical exam and all medical decision making activities.  I have verified that all services and findings are accurately documented in this student's note; and I agree with management and plan as outlined in the documentation. I have also made any necessary editorial changes.  --Patient has not eaten today. Requests meal on arrival --Will check cervix and initiate Pitocin s/p breakfast  02/11/2021, CNM [facility], [facility] 02/09/2021 7:21 AM

## 2021-02-09 NOTE — Progress Notes (Signed)
Rebekah Sanders is a 20 y.o. G2P1001 at [redacted]w[redacted]d by LMP admitted for Elective at term.  Subjective: Ambulating around room. More uncomfortable with stronger and more frequent contractions. Breathing through contractions. Not planning epidural. H/O precipitous labor.  Objective: BP 118/80   Pulse 70   Temp 98.5 F (36.9 C) (Oral)   Resp 16   Ht 5\' 7"  (1.702 m)   Wt 110.5 kg   LMP 05/05/2020   SpO2 97%   BMI 38.17 kg/m  No intake/output data recorded. No intake/output data recorded.  FHT:  FHR: 130 bpm, variability: moderate,  accelerations:  Present,  decelerations:  Present early UC:   regular, every 2-4 minutes SVE:   Dilation: 5.5 Effacement (%): 80 Station: -1 Exam by:: [email or web address] CNM AROM after verbal consent given; small amount of clear fluid in return. Patient tolerated procedure well. Unsure if AROM of only forebag -- 2nd attempt to AROM with gentle fundal pressure; no additional fluid in return.  Labs: Lab Results  Component Value Date   WBC 8.9 02/09/2021   HGB 11.6 (L) 02/09/2021   HCT 34.8 (L) 02/09/2021   MCV 92.1 02/09/2021   PLT 273 02/09/2021    Assessment / Plan: Induction of labor due to term with favorable cervix,  progressing well on pitocin  Labor: Progressing on Pitocin, will continue to increase after AROM Preeclampsia:   n/a Fetal Wellbeing:  Category I Pain Control:  Labor support without medications I/D:  n/a Anticipated MOD:  NSVD  02/11/2021, CNM 02/09/2021, 11:36 AM

## 2021-02-09 NOTE — Lactation Note (Signed)
This note was copied from a baby's chart. Lactation Consultation Note  Patient Name: Rebekah Sanders [id number] Date: 02/09/2021   Age:22 hours  I attempted to see Mom on L&D, but RN stated they were getting ready to move her to the 5th floor. Mom's feeding intention (clarified by RN) is Br/FO.    Lurline Hare [facility] 02/09/2021, 2:43 PM

## 2021-02-09 NOTE — Progress Notes (Signed)
Rebekah Sanders is a 20 y.o. G2P1001 at [redacted]w[redacted]d by LMP admitted for induction of labor due to Elective at term.  Subjective: Patient is comfortable. Ambulating without difficulties. Pitocin started after breakfast.  Objective: BP 127/76   Pulse 86   Temp 97.7 F (36.5 C) (Oral)   Resp 16   Ht 5\' 7"  (1.702 m)   Wt 110.5 kg   LMP 05/05/2020   SpO2 97%   BMI 38.17 kg/m  No intake/output data recorded. No intake/output data recorded.  FHT:  FHR: 140 bpm, variability: moderate,  accelerations:  Present,  decelerations:  Absent UC:   irregular, every 3-6 minutes SVE:   Dilation: 4 Effacement (%): 50 Station: Ballotable Exam by:: Sam Weinhold CNM Pitocin = 4 milli-units/min    Labs: Lab Results  Component Value Date   WBC 8.9 02/09/2021   HGB 11.6 (L) 02/09/2021   HCT 34.8 (L) 02/09/2021   MCV 92.1 02/09/2021   PLT 273 02/09/2021    Assessment / Plan: Induction of labor due to term with favorable cervix,  progressing well on pitocin  Labor: Progressing on Pitocin, will continue to increase then AROM Preeclampsia:   n/a Fetal Wellbeing:  Category I Pain Control:  Labor support without medications - may have epidural upon request I/D:  n/a Anticipated MOD:  NSVD  02/11/2021, CNM 02/09/2021, 8:45 AM

## 2021-02-09 NOTE — Discharge Summary (Signed)
Postpartum Discharge Summary      Patient Name: Rebekah Sanders DOB: [birth year] MRN: [id number]  Date of admission: 02/09/2021 Delivery date:02/09/2021  Delivering provider: Laury Deep  Date of discharge: 02/10/2021  Admitting diagnosis: Post term pregnancy over 40 weeks [O48.0] Intrauterine pregnancy: [redacted]w[redacted]d    Secondary diagnosis:  Principal Problem:   Vaginal delivery Active Problems:   Post term pregnancy over 40 weeks   Obstetrical laceration, second degree  Additional problems: none    Discharge diagnosis: Term Pregnancy Delivered                                              Post partum procedures: Nexplanon Insertion Augmentation: AROM and Pitocin Complications: None  Hospital course: Induction of Labor With Vaginal Delivery   20y.o. yo G2P2002 at 436w0das admitted to the hospital 02/09/2021 for induction of labor.  Indication for induction: Favorable cervix at term and Elective.  Patient had an uncomplicated labor course as follows: Membrane Rupture Time/Date: 11:24 AM ,02/09/2021   Delivery Method:Vaginal, Spontaneous  Episiotomy: None  Lacerations:  2nd degree;Vaginal;Perineal  Details of delivery can be found in separate delivery note.  Patient had a routine postpartum course. Patient is discharged home 02/10/21.  Newborn Data: Birth date:02/09/2021  Birth time:1:00 PM  Gender:Female  Living status:Living  Apgars:9 ,9  Weight:3351 g   Magnesium Sulfate received: No BMZ received: No Rhophylac:N/A MMR:N/A T-DaP:Given prenatally 11/04/2020 Flu: No Transfusion:No  Physical exam  Vitals:   02/09/21 1940 02/10/21 0010 02/10/21 0515 02/10/21 1525  BP: 113/74 (!) 113/59 127/84 133/80  Pulse: 77 73 89 85  Resp: '17 18 17 18  ' Temp: 98.2 F (36.8 C) 98 F (36.7 C) 98.1 F (36.7 C) 97.7 F (36.5 C)  TempSrc: Oral Oral Oral Oral  SpO2: 98% 100% 99% 99%  Weight:      Height:       General: alert, cooperative, and no distress Lochia:  appropriate Uterine Fundus: firm Incision: N/A DVT Evaluation: No evidence of DVT seen on physical exam. Labs: Lab Results  Component Value Date   WBC 8.9 02/09/2021   HGB 11.6 (L) 02/09/2021   HCT 34.8 (L) 02/09/2021   MCV 92.1 02/09/2021   PLT 273 02/09/2021   CMP Latest Ref Rng & Units 09/23/2020  Glucose 70 - 99 mg/dL 79  BUN 6 - 20 mg/dL 5(L)  Creatinine 0.44 - 1.00 mg/dL 0.63  Sodium 135 - 145 mmol/L 134(L)  Potassium 3.5 - 5.1 mmol/L 3.3(L)  Chloride 98 - 111 mmol/L 106  CO2 22 - 32 mmol/L 23  Calcium 8.9 - 10.3 mg/dL 8.4(L)  Total Protein 6.5 - 8.1 g/dL -  Total Bilirubin 0.3 - 1.2 mg/dL -  Alkaline Phos 38 - 126 U/L -  AST 15 - 41 U/L -  ALT 0 - 44 U/L -   Edinburgh Score: Edinburgh Postnatal Depression Scale Screening Tool 02/09/2021  I have been able to laugh and see the funny side of things. 0  I have looked forward with enjoyment to things. 0  I have blamed myself unnecessarily when things went wrong. 0  I have been anxious or worried for no good reason. 0  I have felt scared or panicky for no good reason. 0  Things have been getting on top of me. 0  I have been so unhappy that I have  had difficulty sleeping. 0  I have felt sad or miserable. 0  I have been so unhappy that I have been crying. 0  The thought of harming myself has occurred to me. 0  Edinburgh Postnatal Depression Scale Total 0     After visit meds:  Allergies as of 02/10/2021       Reactions   Penicillins Hives        Medication List     STOP taking these medications    terconazole 0.4 % vaginal cream Commonly known as: TERAZOL 7       TAKE these medications    ibuprofen 600 MG tablet Commonly known as: ADVIL Take 1 tablet (600 mg total) by mouth every 6 (six) hours.   Prenatal Complete 14-0.4 MG Tabs Take 1 tablet by mouth daily.         Discharge home in stable condition Infant Feeding: Bottle and Breast Infant Disposition:home with mother Discharge instruction:  per After Visit Summary and Postpartum booklet. Activity: Advance as tolerated. Pelvic rest for 6 weeks.  Diet: routine diet Future Appointments:No future appointments. Follow up Visit:  [place] for [place] at [facility] Follow up.   Specialty: Obstetrics and Gynecology Contact information: [place] [id number] [phone]                Message sent to [facility] by Laury Deep, CNM on 02/09/2021 @ 1422 Please schedule this patient for a In person postpartum visit in 4 weeks with the following provider: Any provider. Will need TOC for [facility] at [facility] visit. Additional Postpartum F/U:Postpartum Depression checkup - h/o bipolar and anxiety (per patient) Low risk pregnancy complicated by:  none Delivery mode:  Vaginal, Spontaneous  Anticipated Birth Control:  Nexplanon   02/10/2021 Maryann Conners, CNM

## 2021-02-10 DIAGNOSIS — Z30017 Encounter for initial prescription of implantable subdermal contraceptive: Secondary | ICD-10-CM

## 2021-02-10 MED ORDER — LIDOCAINE HCL 1 % IJ SOLN
0.0000 mL | Freq: Once | INTRAMUSCULAR | Status: DC | PRN
  Filled 2021-02-10: qty 20

## 2021-02-10 MED ORDER — IBUPROFEN 600 MG PO TABS: 600.0000 mg | ORAL_TABLET | Freq: Four times a day (QID) | ORAL | 0 refills | Status: DC

## 2021-02-10 MED ORDER — ETONOGESTREL 68 MG ~~LOC~~ IMPL
68.0000 mg | DRUG_IMPLANT | Freq: Once | SUBCUTANEOUS | Status: AC
  Administered 2021-02-10: 68 mg via SUBCUTANEOUS
  Filled 2021-02-10: qty 1

## 2021-02-10 NOTE — Procedures (Addendum)
  Nexplanon Insertion Procedure: Exp: Jan 26, 2020 Lot: [id number] NDC: 93818-299-37   Rebekah Sanders requests insertion of Nexplanon for contraception method.  She understands the risks associated with insertion including pain, bleeding, infection, and paresthesias of the arm.  Patient also understands that Nexplanon can cause change in bleeding.  However, patient accepts and understands all these risks and desires to proceed.  Nexplanon inserted as below.  Informed consent signed.   Appropriate time out taken.  Patient's non-dominant left arm was identified prepped and draped in the usual sterile fashion. Patient requests insertion into previous site.  The area was identified ~8 cm from epicondyle and 3cm posterior to the sulcus between the biceps and tricep muscles. The area was prepped with alcohol swab and then injected with 2.78mL of 1% lidocaine along the anticipated insertion route.  The area was then prepped with betadine and allowed 60 seconds before being wiped away with sterile gauze. Nexplanon was removed from packaging and device was confirmed within needle by provider visualization.   The device was then inserted per manufacturers instruction without complications.  The device was then palpated in the patient's arm by patient and provider. The insertion area was hemostatic with pressure and covered with steri strip and band-aid.  The arm was then wrapped with pressure dressing.  The patient tolerated the procedure well and was given post procedure instructions to include: *Remove of pressure dressing and band-aid in 12-24 hours. *Remove of steri-strips after no more than 7 days. *Tylenol or ibuprofen for insertion pain/discomfort. *Call for any other questions, concerns, or complications.   Cherre Robins MSN, CNM 02/10/2021

## 2021-02-10 NOTE — Progress Notes (Signed)
Patient ID: Rebekah Sanders, female   DOB: 05-19-2001, 20 y.o.   MRN: [id number] Post Partum Day One:S/P SVD with Repair with 2nd Laceration.  Subjective: Patient up ad lib, denies syncope or dizziness. Reports consuming regular diet without issues and denies current N/V.  She reports some nausea last night that resided after eating.  Denies issues with urination and reports bleeding is "good."  Patient is breastfeeding and reports going well with some cramping, but coping well.  Desires Nexplanon for postpartum contraception.  Pain is being appropriately managed with use of ibuprofen.  Objective: Vitals:   02/09/21 1600 02/09/21 1940 02/10/21 0010 02/10/21 0515  BP: 124/78 113/74 (!) 113/59 127/84  Pulse: 79 77 73 89  Resp: 18 17 18 17   Temp:  98.2 F (36.8 C) 98 F (36.7 C) 98.1 F (36.7 C)  TempSrc:  Oral Oral Oral  SpO2:  98% 100% 99%  Weight:      Height:       Recent Labs    02/09/21 0623  HGB 11.6*  HCT 34.8*    Physical Exam:  General: alert, cooperative, and no distress Mood/Affect: Appropriate/Bright Lungs: clear to auscultation, no wheezes, rales or rhonchi, symmetric air entry.  Heart: normal rate and regular rhythm. Breast: not examined. Abdomen:  Soft, NT Uterine Fundus: firm, U/-1 Lochia: appropriate Laceration: Not Evaluated Skin: Warm, Dry DVT Evaluation: No evidence of DVT seen on physical exam.  Assessment S/P Vaginal Delivery-Day One Normal Involution BreastFeeding Desires Nexplanon  Plan: -Encouraged to maintain proper nutritional intake, especially prior to breastfeeding, to avoid feelings of nausea.  -Informed that she can contact nurse for antiemetics as necessary.  -Discussed Nexplanon risks including change in bleeding pattern.  -Reviewed Nexplanon insertion risks. -Nexplanon inserted; See procedure note.  -Okay for discharge today if infant cleared. -Nurse instructed to contact provider for discharge order.   02/11/21, MSN,  CNM 02/10/2021, 1:23 PM

## 2021-02-10 NOTE — Clinical Social Work Maternal (Signed)
CLINICAL SOCIAL WORK MATERNAL/CHILD NOTE  Patient Details  Name: Rebekah Sanders MRN: [id number] Date of Birth: 07/18/2001  Date:  May 25, 2021  Clinical Social Worker Initiating Note:  Rebekah Sanders, [place] Date/Time: Initiated:  02/10/21/0915     Child's Name:  Rebekah Sanders   Biological Parents:  Mother, Father Rebekah Sanders 05/16/1988)   Need for Interpreter:  None   Reason for Referral:  Current Substance Use/Substance Use During Pregnancy     Address:  [address] [place] [address]    Phone number:  [phone] (home)     Additional phone number:   Household Members/Support Persons (HM/SP):   Household Member/Support Person 1, Household Member/Support Person 2   HM/SP Name Relationship DOB or Age  HM/SP -1 Rebekah Sanders Significant Other 05/16/1988  HM/SP -2 [name] Son 02/03/2020  HM/SP -3        HM/SP -4        HM/SP -5        HM/SP -6        HM/SP -7        HM/SP -8          Natural Supports (not living in the home):  Parent   Professional Supports: None   Employment:     Type of Work:     Education:  [place] arranged:    Museum/gallery curator Resources:  Medicaid   Other Resources:  Physicist, medical  , [employer] Considerations Which May Impact Care:    Strengths:  Ability to meet basic needs  , Home prepared for child  , Pediatrician chosen   Psychotropic Medications:         Pediatrician:    [place] area  Pediatrician List:   [facility] for [place]      Pediatrician Fax Number:    Risk Factors/Current Problems:  Substance Use     Cognitive State:  Alert  , Linear Thinking     Mood/Affect:  Calm  , Happy     CSW Assessment: CSW consulted for bipolar and THC use. CSW met with MOB to complete assessment and offer support. CSW introduced self and role. MOB was welcoming and  pleasant. MOB declined to have FOB leave the room for assessment. CSW informed MOB of reason for consult and assessed current emotions. MOB reported she is "feeling good" and had a good pregnancy. CSW inquired on MOB mental health history. MOB stated she does not have Bipolar or ADHD. MOB shared she was diagnosed with Bipolar at age 48, however she has not had any symptoms of the diagnosis since that time. MOB denies ever being prescribed medication or attending therapy. MOB identified her mother as a support and denies any current SI or HI.   CSW inquired on substance use. MOB reported she used a THC vape pen one or two times. MOB reported her last use was at 7 or 8 months. MOB denies using any additional substances during pregnancy. CSW informed MOB of the hospital drug screen policy, MOB aware infant UDS is negative and the CDS will be followed. MOB denies any previous CPS history.   MOB reported she resides with FOB and their son. MOB receives [facility] and food stamp resources. MOB denies any barriers to follow-up care. MOB stated she has all essentials for infant.   CSW  provided education regarding the baby blues period versus PPD. CSW provided the New Mom Checklist and encouraged MOB to self evaluate and contact a medical professional if symptoms are noted at any time.   CSW provided review of Sudden Infant Death Syndrome (SIDS) precautions.    CSW will continue to follow CDS and make a CPS report if warranted. CSW identifies no further need for intervention and no barriers to discharge at this time.  CSW Plan/Description:  No Further Intervention Required/No Barriers to Discharge, Child Protective Service Report  , CSW Will Continue to Monitor Umbilical Cord Tissue Drug Screen Results and Make Report if Warranted, Other Patient/Family Education, Other Information/Referral to [employer], Sudden Infant Death Syndrome (SIDS) Education, [place], Perinatal Mood and  Anxiety Disorder (PMADs) Education    Waylan Boga, [place] Sep 09, 2020, 10:02 AM

## 2021-02-13 ENCOUNTER — Encounter: Payer: Self-pay | Admitting: *Deleted

## 2021-02-19 ENCOUNTER — Telehealth (HOSPITAL_COMMUNITY): Payer: Self-pay

## 2021-02-19 NOTE — Telephone Encounter (Signed)
No answer. Left message to return nurse call.  Marcelino Duster [facility] 02/19/2021,1837

## 2021-03-18 ENCOUNTER — Other Ambulatory Visit: Payer: Self-pay

## 2021-03-18 ENCOUNTER — Ambulatory Visit (INDEPENDENT_AMBULATORY_CARE_PROVIDER_SITE_OTHER): Payer: Medicaid Other | Admitting: Nurse Practitioner

## 2021-03-18 ENCOUNTER — Encounter: Payer: Self-pay | Admitting: Nurse Practitioner

## 2021-03-18 ENCOUNTER — Other Ambulatory Visit (HOSPITAL_COMMUNITY)
Admission: RE | Admit: 2021-03-18 | Discharge: 2021-03-18 | Disposition: A | Discharge status: Home / Self Care | Payer: Medicaid Other | Source: Ambulatory Visit | Attending: Nurse Practitioner | Admitting: Nurse Practitioner

## 2021-03-18 DIAGNOSIS — K649 Unspecified hemorrhoids: Secondary | ICD-10-CM | POA: Insufficient documentation

## 2021-03-18 DIAGNOSIS — F419 Anxiety disorder, unspecified: Secondary | ICD-10-CM | POA: Insufficient documentation

## 2021-03-18 DIAGNOSIS — Z975 Presence of (intrauterine) contraceptive device: Secondary | ICD-10-CM

## 2021-03-18 DIAGNOSIS — Z800 Family history of malignant neoplasm of digestive organs: Secondary | ICD-10-CM

## 2021-03-18 DIAGNOSIS — B379 Candidiasis, unspecified: Secondary | ICD-10-CM

## 2021-03-18 MED ORDER — HYDROXYZINE PAMOATE 25 MG PO CAPS: 25.0000 mg | ORAL_CAPSULE | Freq: Three times a day (TID) | ORAL | 0 refills | Status: DC | PRN

## 2021-03-18 NOTE — Patient Instructions (Signed)
AREA FAMILY PRACTICE PHYSICIANS  [place] ([address]) [facility] [address] [place], [place] [address] [phone] Mon-Fri 8:30-12:30, 1:30-5:00 Accepting Medicaid [facility] [address], [place], [place] [address] [phone] Mon-Fri 8:00-5:30 [facility] [address] [place], [place] [address] [phone] Mon, Tue, Thur, Fri 8:30-5:00, Wed 10:00-7:00 (closed 1-2pm) Accepting Medicaid [facility] [address], [place], [place]  [address] Phone - [phone]   Fax - [phone]  [place] ([address]) [facility] [address] [place], [place] [address] [phone] Mon-Fri 8:00-5:00 [facility] ([facility])  [address] [place], [place] [address] [phone] Mon-Fri 8:30-5:30, Sat (Oct.-Mar.) 9:00-1:00 Accepting Medicaid  [place] ([address]) [facility] [address], [place], [place] [address] [phone] Mon-Fri 8:00-5:00  [place] ([address]) [facility] [address], [place], [place] [address] [phone] Mon-Fri 8:00-5:00 [facility] [address], [place], [place] [address] [phone] Mon-Fri 8:00-5:00 [facility] [address] [place], [place] [address] [phone] Mon-Fri 8:00-5:00 [facility] [address] [place] [address] [phone] Mon-Fri 7:30-5:30  [place] ([address] & [address]) [facility] [address] [place], [place] [address] [phone] Mon-Thur 8:00-6:00 Accepting Medicaid [facility] [address] [place], [place] [address] [phone] Mon-Thur 7:30-7:30, Fri 7:30-4:30 Accepting  Medicaid [facility] [address], [place], [place]  [address] [phone]   Fax - [phone]  [place] ([address] & [address]) [facility] [address] [place], [place] [address] [phone] Mon-Fri 7:00-5:00 [facility] [address], [place], [place] [address] [phone] Mon-Fri 8:00-5:00 Accepting Medicaid [facility] [address], [place], [place] [address] [phone] Mon-Fri 8:00-5:00 Accepting Medicaid  [place] ([address]) [facility] [place] [address] [place], [place] [address] [phone] Mon-Fri 8:00-5:00 [facility] [address], [place], [place] [address] [phone] Mon-Fri 8:00-5:00 Accepting Medicaid [facility] ([facility] [address], [place], [place] [address] [phone] Mon-Fri 8:00-5:30, Sat&Sun by appointment (phones open at 8:30) Accepting Medicaid  [place] ([address]) [facility] [address] [place], [place] [address] [phone] Mon-Thur 8:00-7:00, Fri 8:00-5:00, Sat 8:00-12:00, Sun 9:00-12:00 Accepting Medicaid [facility] [address], [place], [place] [address] [phone] Mon-Thur 8:00-5:00 Accepting Medicaid [facility] [address] [place], [place] [address] [phone] Mon-Fri 8:00-5:30, Sat (Oct.-Mar.) 9:00-1:00 Accepting Medicaid  [place] ([address]) [facility] [address], [place], [place] [address] [phone] Mon-Fri 8:00-5:00 Accepting Medicaid   [place] ([address]) [facility] [place] [address], [place], [place] [address] [phone] Mon-Fri 8:00-5:00 [facility] [place] [address], [place], [place] [address] [phone] Mon-Fri 8:00-5:00 [facility] [place] [address] [place] [address], [place], [place] [address] [phone] Mon-Fri 8:00-5:00 After hours clinic ([address] [place],  [address]) [phone] Mon-Fri 5:00-8:00, Sat 12:00-6:00, Sun 10:00-4:00 Accepting Medicaid [facility] [place]   [address], [place], [place]  [address] [phone]   Fax - [phone]  [place] ([address])  [facility] [place] [facility] [address], [place], [place] [address] [phone] Mon-Fri 8:00-5:00 [facility] [place] ([facility] [place]) [address], [place], [place] [address] [phone] Mon-Thur 8:00-7:00, Fri 8:00-5:00, Sat 8:00-12:00    

## 2021-03-18 NOTE — Progress Notes (Signed)
a   Post Partum Visit Note  Rebekah Sanders is a 20 y.o. G49P2002 female who presents for a postpartum visit. She is 5 weeks postpartum following a normal spontaneous vaginal delivery.  I have fully reviewed the prenatal and intrapartum course. The delivery was at [redacted]w[redacted]d gestational weeks.  Anesthesia: none. Postpartum course has been stressful. Baby is doing well. Baby is feeding by bottle - Gerber Gentle . Bleeding no bleeding. Bowel function is normal. Bladder function is normal. Patient is sexually active. Contraception method is Nexplanon. Postpartum depression screening: positive.  Got Nexplanon while in the hospital.   Inocente Salles Postnatal Depression Scale - 03/18/21 1035       Edinburgh Postnatal Depression Scale:  In the Past 7 Days   I have been able to laugh and see the funny side of things. 1    I have looked forward with enjoyment to things. 1    I have blamed myself unnecessarily when things went wrong. 2    I have been anxious or worried for no good reason. 3    I have felt scared or panicky for no good reason. 0    Things have been getting on top of me. 2    I have been so unhappy that I have had difficulty sleeping. 0    I have felt sad or miserable. 2    I have been so unhappy that I have been crying. 1    The thought of harming myself has occurred to me. 0    Edinburgh Postnatal Depression Scale Total 12             Health Maintenance Due  Topic Date Due   COVID-19 Vaccine (1) Never done   INFLUENZA VACCINE  02/23/2021    The following portions of the patient's history were reviewed and updated as appropriate: allergies, current medications, past family history, past medical history, past social history, past surgical history, and problem list.  Review of Systems Pertinent items noted in HPI and remainder of comprehensive ROS otherwise negative.  Objective:  BP 137/76   Ht 5\' 7"  (1.702 m)   Wt 217 lb 1.6 oz (98.5 kg)   LMP 05/05/2020   Breastfeeding  No   BMI 34.00 kg/m    General:  alert, cooperative, and no distress   Breasts:  deferred  Lungs: clear to auscultation bilaterally  Heart:  regular rate and rhythm, S1, S2 normal, no murmur, click, rub or gallop  Abdomen: Soft nontender    Wound  NA  GU exam:  not indicated      Nexplanon palpated in inner aspect of left upper arm Assessment:   Normal postpartum exam.  Anxiety Hemorrhoids Family history of colon cancer  Plan:   Essential components of care per ACOG recommendations:  1.  Mood and well being: Patient with positive depression screening today. Reviewed local resources for support. No suicidal ideations - Patient tobacco use? Yes. Patient desires to quit? Yes.Discussed reduction and cessation  - hx of drug use? No.    2. Infant care and feeding:  -Patient currently breastmilk feeding? No.  -Social determinants of health (SDOH) reviewed in EPIC. No concerns  3. Sexuality, contraception and birth spacing Currently has Nexplanon and does not have any problems with vaginal bleeding.  4. Sleep and fatigue -Encouraged family/partner/community support of 4 hrs of uninterrupted sleep to help with mood and fatigue  5. Physical Recovery  - Discussed patients delivery and complications. She describes her labor as good. -  Patient had a  vaginal birth with a 2nd degree laceration .  Perineal healing reviewed. Patient denies any perineal problems and does not want perineum checked. - Patient has urinary incontinence? Occasionally - reviewed Kegels and given instructions to do these.  If incontinence does not resolve,, reviewed PT available in the office.  Patient to call if she continues to have problems. - Patient is safe to resume physical and sexual activity  6.  Health Maintenance - HM due items addressed - Yes - Last pap smear  Diagnosis  Date Value Ref Range Status  02/13/2019   Final   NEGATIVE FOR INTRAEPITHELIAL LESIONS OR MALIGNANCY.   Pap smear not done at  today's visit.  -Breast Cancer screening indicated? No.   7. Chronic Disease/Pregnancy Condition follow up: Anxiety and Depression - appointment made for Behavioral Health in the office.  Reviewed to stop smoking.  Resumed after pregnancy 2 cigarettes a day while at work.  Reviewed techniques to reduce smoking at work. Reviewed current BMI and continued weigh loss encouraged. TOC for trich done today.  - PCP follow up  Currie Paris, NP [facility] [employer], [facility]

## 2021-03-19 LAB — CERVICOVAGINAL ANCILLARY ONLY
Bacterial Vaginitis (gardnerella): NEGATIVE
Candida Glabrata: NEGATIVE
Candida Vaginitis: POSITIVE — AB
Chlamydia: NEGATIVE
Comment: NEGATIVE
Comment: NEGATIVE
Comment: NEGATIVE
Comment: NEGATIVE
Comment: NEGATIVE
Comment: NORMAL
Neisseria Gonorrhea: NEGATIVE
Trichomonas: NEGATIVE

## 2021-03-19 MED ORDER — FLUCONAZOLE 150 MG PO TABS: 150.0000 mg | ORAL_TABLET | Freq: Once | ORAL | 0 refills | Status: AC

## 2021-03-19 NOTE — Addendum Note (Signed)
Addended by: Currie Paris on: 03/19/2021 03:55 PM   Modules accepted: Orders

## 2021-03-19 NOTE — BH Specialist Note (Signed)
Pt did not arrive to video visit and did not answer the phone listed as primary number (cell); pt's mother answered home phone number and so left HIPPA-compliant message to call back Asher Muir from [employer] for [employer] at [facility] at  [phone] [facility] office); left MyChart message for patient.

## 2021-03-23 ENCOUNTER — Ambulatory Visit: Payer: Medicaid Other | Admitting: Clinical

## 2021-03-23 DIAGNOSIS — Z91199 Patient's noncompliance with other medical treatment and regimen due to unspecified reason: Secondary | ICD-10-CM

## 2021-03-23 DIAGNOSIS — Z5329 Procedure and treatment not carried out because of patient's decision for other reasons: Secondary | ICD-10-CM

## 2021-04-09 NOTE — BH Specialist Note (Deleted)
Integrated Behavioral Health via Telemedicine Visit  04/09/2021 Rebekah Sanders [id number]  Number of Integrated Behavioral Health visits: *** Session Start time: ***  Session End time: *** Total time: {IBH Total Time:21014050}  Referring Provider: *** Patient/Family location: *** [facility] Provider location: *** All persons participating in visit: *** Types of Service: {CHL AMB TYPE OF [phone]}  I connected with Rebekah Sanders and/or Rebekah Sanders's {family members:20773} via  Telephone or Video Enabled Telemedicine Application  (Video is Caregility application) and verified that I am speaking with the correct person using two identifiers. Discussed confidentiality: {YES/NO:21197}  I discussed the limitations of telemedicine and the availability of in person appointments.  Discussed there is a possibility of technology failure and discussed alternative modes of communication if that failure occurs.  I discussed that engaging in this telemedicine visit, they consent to the provision of behavioral healthcare and the services will be billed under their insurance.  Patient and/or legal guardian expressed understanding and consented to Telemedicine visit: {YES/NO:21197}  Presenting Concerns: Patient and/or family reports the following symptoms/concerns: *** Duration of problem: ***; Severity of problem: {Mild/Moderate/Severe:20260}  Patient and/or Family's Strengths/Protective Factors: {CHL AMB BH PROTECTIVE [phone]}  Goals Addressed: Patient will:  Reduce symptoms of: {IBH Symptoms:21014056}   Increase knowledge and/or ability of: {IBH Patient Tools:21014057}   Demonstrate ability to: {IBH Goals:21014053}  Progress towards Goals: {CHL AMB BH PROGRESS TOWARDS GOALS:[phone]}  Interventions: Interventions utilized:  {IBH Interventions:21014054} Standardized Assessments completed: {IBH Screening Tools:21014051}  Patient and/or Family Response:  ***  Assessment: Patient currently experiencing ***.   Patient may benefit from ***.  Plan: Follow up with behavioral health clinician on : *** Behavioral recommendations: *** Referral(s): {IBH Referrals:21014055}  I discussed the assessment and treatment plan with the patient and/or parent/guardian. They were provided an opportunity to ask questions and all were answered. They agreed with the plan and demonstrated an understanding of the instructions.   They were advised to call back or seek an in-person evaluation if the symptoms worsen or if the condition fails to improve as anticipated.  Valetta Close Kyarra Vancamp, LCSW

## 2021-04-13 ENCOUNTER — Institutional Professional Consult (permissible substitution): Payer: Self-pay

## 2021-04-29 ENCOUNTER — Other Ambulatory Visit: Payer: Self-pay

## 2021-04-29 DIAGNOSIS — F419 Anxiety disorder, unspecified: Secondary | ICD-10-CM

## 2021-04-30 ENCOUNTER — Other Ambulatory Visit: Payer: Self-pay

## 2021-04-30 DIAGNOSIS — F419 Anxiety disorder, unspecified: Secondary | ICD-10-CM

## 2021-04-30 MED ORDER — HYDROXYZINE PAMOATE 25 MG PO CAPS: 25.0000 mg | ORAL_CAPSULE | Freq: Three times a day (TID) | ORAL | 0 refills | Status: AC | PRN

## 2021-04-30 NOTE — Progress Notes (Signed)
Pt sent message regarding refill on Rx Hydroxyzine. Pt has hx of anxiety. Pt had PP visit with Camelia Eng, NP on 03/18/21. Pt advised will refill for 1 month and then have follow up with out patient BH or have PCP to manage.  Referral placed to [facility] for follow up.  Judeth Cornfield, RN

## 2021-05-15 ENCOUNTER — Other Ambulatory Visit: Payer: Self-pay | Admitting: Nurse Practitioner

## 2021-05-15 DIAGNOSIS — F419 Anxiety disorder, unspecified: Secondary | ICD-10-CM

## 2021-08-03 ENCOUNTER — Ambulatory Visit (INDEPENDENT_AMBULATORY_CARE_PROVIDER_SITE_OTHER): Payer: Medicaid Other | Admitting: Clinical

## 2021-08-03 ENCOUNTER — Other Ambulatory Visit: Payer: Self-pay

## 2021-08-03 DIAGNOSIS — F39 Unspecified mood [affective] disorder: Secondary | ICD-10-CM | POA: Diagnosis not present

## 2021-08-03 NOTE — Patient Instructions (Signed)
[facility] [address] [place], [place] [address] [phone] (main office) [phone] (Sharlyn Odonnel's office)  Coping with Panic Attacks   What is a panic attack?  You may have had a panic attack if you experienced four or more of the symptoms listed below coming on abruptly and peaking in about 10 minutes.  Panic Symptoms    Pounding heart   Sweating   Trembling or shaking   Shortness of breath   Feeling of choking   Chest pain   Nausea or abdominal distress     Feeling dizzy, unsteady, lightheaded, or faint   Feelings of unreality or being detached from yourself   Fear of losing control or going crazy   Fear of dying   Numbness or tingling   Chills or hot flashes      Panic attacks are sometimes accompanied by avoidance of certain places or situations. These are often situations that would be difficult to escape from or in which help might not be available. Examples might include crowded shopping malls, public transportation, restaurants, or driving.   Why do panic attacks occur?   Panic attacks are the body's alarm system gone awry. All of us have a built-in alarm system, powered by adrenaline, which increases our heart rate, breathing, and blood flow in response to danger. Ordinarily, this 'danger response system' works well. In some people, however, the response is either out of proportion to whatever stress is going on, or may come out of the blue without any stress at all.   For example, if you are walking in the woods and see a bear coming your way, a variety of changes occur in your body to prepare you to either fight the danger or flee from the situation. Your heart rate will increase to get more blood flow around your body, your breathing rate will quicken so that more oxygen is available, and your muscles will tighten in order to be ready to fight or run. You may feel nauseated as blood flow leaves your stomach area and  moves into your limbs. These bodily changes are all essential to helping you survive the dangerous situation. After the danger has passed, your body functions will begin to go back to normal. This is because your body also has a system for "recovering" by bringing your body back down to a normal state when the danger is over.   As you can see, the emergency response system is adaptive when there is, in fact, a "true" or "real" danger (e.g., bear). However, sometimes people find that their emergency response system is triggered in "everyday" situations where there really is no true physical danger (e.g., in a meeting, in the grocery store, while driving in normal traffic, etc.).   What triggers a panic attack?  Sometimes particularly stressful situations can trigger a panic attack. For example, an argument with your spouse or stressors at work can cause a stress response (activating the emergency response system) because you perceive it as threatening or overwhelming, even if there is no direct risk to your survival.  Sometimes panic attacks don't seem to be triggered by anything in particular- they may "come out of the blue". Somehow, the natural "fight or flight" emergency response system has gotten activated when there is no real danger. Why does the body go into "emergency mode" when there is no real danger?   Often, people with panic attacks are frightened or alarmed by the physical sensations of   the emergency response system. First, unexpected physical sensations are experienced (tightness in your chest or some shortness of breath). This then leads to feeling fearful or alarmed by these symptoms ("Something's wrong!", "Am I having a heart attack?", "Am I going to faint?") The mind perceives that there is a danger even though no real danger exists. This, in turn, activates the emergency response system ("fight or flight"), leading to a "full blown" panic attack. In summary, panic attacks occur when we  misinterpret physical symptoms as signs of impending death, craziness, loss of control, embarrassment, or fear of fear. Sometimes you may be aware of thoughts of danger that activate the emergency response system (for example, thinking "I'm having a heart attack" when you feel chest pressure or increased heart rate). At other times, however, you may not be aware of such thoughts. After several incidences of being afraid of physical sensations, anxiety and panic can occur in response to the initial sensations without conscious thoughts of danger. Instead, you just feel afraid or alarmed. In other words, the panic or fear may seem to occur "automatically" without you consciously telling yourself anything.   After having had one or more panic attacks, you may also become more focused on what is going on inside your body. You may scan your body and be more vigilant about noticing any symptoms that might signal the start of a panic attack. This makes it easier for panic attacks to happen again because you pick up on sensations you might otherwise not have noticed, and misinterpret them as something dangerous. A panic attack may then result.      How do I cope with panic attacks?  An important part of overcoming panic attacks involves re-interpreting your body's physical reactions and teaching yourself ways to decrease the physical arousal. This can be done through practicing the cognitive and behavioral interventions below.   Research has found that over half of people who have panic attacks show some signs of hyperventilation or overbreathing. This can produce initial sensations that alarm you and lead to a panic attack. Overbreathing can also develop as part of the panic attack and make the symptoms worse. When people hyperventilate, certain blood vessels in the body become narrower. In particular, the brain may get slightly less oxygen. This can lead to the symptoms of dizziness, confusion, and  lightheadedness that often occur during panic attacks. Other parts of the body may also get a bit less oxygen, which may lead to numbness or tingling in the hands or feet or the sensation of cold, clammy hands. It also may lead the heart to pump harder. Although these symptoms may be frightening and feel unpleasant, it is important to remember that hyperventilating is not dangerous. However, you can help overcome the unpleasantness of overbreathing by practicing Breathing Retraining.   Practice this basic technique three times a day, every day:   Inhale. With your shoulders relaxed, inhale as slowly and deeply as you can while you count to six. If you can, use your diaphragm to fill your lungs with air.   Hold. Keep the air in your lungs as you slowly count to four.   Exhale. Slowly breath out as you count to six.   Repeat. Do the inhale-hold-exhale cycle several times. Each time you do it, exhale for longer counts.  Like any new skill, Breathing Retraining requires practice. Try practicing this skill twice a day for several minutes. Initially, do not try this technique in specific situations or when you   become frightened or have a panic attack. Begin by practicing in a quiet environment to build up your skill level so that you can later use it in time of "emergency."   2. Decreasing Avoidance  Regardless of whether you can identify why you began having panic attacks or whether they seemed to come out of the blue, the places where you began having panic attacks often can become triggers themselves. It is not uncommon for individuals to begin to avoid the places where they have had panic attacks. Over time, the individual may begin to avoid more and more places, thereby decreasing their activities and often negatively impacting their quality of life. To break the cycle of avoidance, it is important to first identify the places or situations that are being avoided, and then to do some "relearning."  To  begin this intervention, first create a list of locations or situations that you tend to avoid. Then choose an avoided location or situation that you would like to target first. Now develop an "exposure hierarchy" for this situation or location. An "exposure hierarchy" is a list of actions that make you feel anxious in this situation. Order these actions from least to most anxiety-producing. It is often helpful to have the first item on your hierarchy involve thinking or imagining part of the feared/avoided situation.   Here is an example of an exposure hierarchy for decreasing avoidance of the grocery store. Note how it is ordered from the least amount of anxiety (at the top) to the most anxiety (at the bottom):   Think about going to the grocery store alone.   Go to the grocery store with a friend or family member.   Go to the grocery store alone to pick up a few small items (5-10 minutes in the store).   Shopping for 10-20 minutes in the store alone.   Doing the shopping for the week by myself (20-30 minutes in the store).   Your homework is to "expose" yourself to the lowest item on your hierarchy and use your breathing relaxation and coping statements (see below) to help you remain in the situation. Practice this several times during the upcoming week. Once you have mastered each item with minimal anxiety, move on to the next higher action on your list.   Cognitive Interventions  Identify your negative self-talk Anxious thoughts can increase anxiety symptoms and panic. The first step in changing anxious thinking is to identify your own negative, alarming self-talk. Some common alarming thoughts:  I'm having a heart attack.            I must be going crazy. I think I'm dying. People will think I'm crazy. I'm going to pass our.  Oh no- here it comes.  I can't stand this.  I've got to get out of here!  2. Use positive coping statements Changing or disrupting a pattern of anxious thoughts  by replacing them with more calming or supportive statements can help to divert a panic attack. Some common helpful coping statements:  This is not an emergency.  I don't like feeling this way, but I can accept it.  I can feel like this and still be okay.  This has happened before, and I was okay. I'll be okay this time, too.  I can be anxious and still deal with this situation.   /Emotional Wellbeing Apps and Websites Here are a few free apps meant to help you to help yourself.  To find, try searching on   the internet to see if the app is offered on Apple/Android devices. If your first choice doesn't come up on your device, the good news is that there are many choices! Play around with different apps to see which ones are helpful to you.    Calm This is an app meant to help increase calm feelings. Includes info, strategies, and tools for tracking your feelings.      Calm Harm  This app is meant to help with self-harm. Provides many 5-minute or 15-min coping strategies for doing instead of hurting yourself.       Healthy Minds Health Minds is a problem-solving tool to help deal with emotions and cope with stress you encounter wherever you are.      MindShift This app can help people cope with anxiety. Rather than trying to avoid anxiety, you can make an important shift and face it.      MY3  MY3 features a support system, safety plan and resources with the goal of offering a tool to use in a time of need.       My Life My Voice  This mood journal offers a simple solution for tracking your thoughts, feelings and moods. Animated emoticons can help identify your mood.       Relax Melodies Designed to help with sleep, on this app you can mix sounds and meditations for relaxation.      Smiling Mind Smiling Mind is meditation made easy: it's a simple tool that helps put a smile on your mind.        Stop, Breathe & Think  A friendly, simple guide for people through  meditations for mindfulness and compassion.  Stop, Breathe and Think Kids Enter your current feelings and choose a "mission" to help you cope. Offers videos for certain moods instead of just sound recordings.       Team Orange The goal of this tool is to help teens change how they think, act, and react. This app helps you focus on your own good feelings and experiences.      The Virtual Hope Box The Virtual Hope Box (VHB) contains simple tools to help patients with coping, relaxation, distraction, and positive thinking.     

## 2021-08-03 NOTE — BH Specialist Note (Signed)
Integrated Behavioral Health Initial In-Person Visit  MRN: [id number] Name: Rebekah Sanders  Number of Integrated Behavioral Health Clinician visits:: 1/6 Session Start time: 1:50  Session End time: 2:40 Total time: 50  minutes  Types of Service: Individual psychotherapy  Interpretor:Yes.   Interpretor Name and Language: y    Warm Hand Off Completed.        Subjective: Rebekah Sanders is a 21 y.o. female accompanied by  children Patient was referred by Nolene Bernheim, NP for anxiety postpartum. 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 Duration of problem: Increase postpartum; Severity of problem: severe  Objective: Mood: Anxious and Affect: Appropriate Risk of harm to self or others: No plan to harm self or others  Life Context: Family and Social: Pt has two children (57mo son; 35mo daughter) School/Work: Works at two group homes (76 hours/week) Self-Care: Recognizing a greater need for self-care Life Changes: Two pregnancies/childbirths in less than two years  Patient and/or Family's Strengths/Protective Factors: Social connections, Concrete supports in place (healthy food, safe environments, etc.), and Sense of purpose  Goals Addressed: Patient will: Reduce symptoms of: anxiety, depression, and stress Increase knowledge and/or ability of: self-management skills and stress reduction  Demonstrate ability to: Increase healthy adjustment to current life circumstances  Progress towards Goals: Ongoing  Interventions: Interventions utilized: Mindfulness or Management consultant, Sleep Hygiene, and Psychoeducation and/or Health Education  Standardized Assessments completed: GAD-7 and  PHQ 9  Patient and/or Family Response: Pt agrees with treatment plan  Patient Centered Plan: Patient is on the following Treatment Plan(s):  IBH  Assessment: Patient currently experiencing Mood disorder, unspecified.   Patient may benefit from psychoeducation and brief therapeutic interventions regarding coping with symptoms of anxiety, depression, life stress .  Plan: Follow up with behavioral health clinician on : Two weeks Behavioral recommendations:  -Continue taking Vistaril/hydroxyzine as prescribed -CALM relaxation breathing exercise twice daily (morning; at bedtime with sleep sounds) -Accept referral to [facility] for ongoing BH medication management and ongoing therapy -Read educational materials regarding coping with symptoms of anxiety with panic  -Continue to consider reducing work hours AND scheduling in personal time weekly for enjoyable activities, away from work Referral(s): Integrated Art gallery manager (In Clinic) and [facility] (LME/Outside Clinic)  Rae [place], [place]  Depression screen [facility] 2/9 08/03/2021 03/18/2021 02/05/2021 01/28/2021 01/20/2021  Decreased Interest 0 1 0 0 0  Down, Depressed, Hopeless 1 2 0 0 0  PHQ - 2 Score 1 3 0 0 0  Altered sleeping 3 0 0 0 0  Tired, decreased energy 3 0 0 0 0  Change in appetite 3 0 0 0 0  Feeling bad or failure about yourself  0 1 0 0 0  Trouble concentrating 3 3 0 0 0  Moving slowly or fidgety/restless 2 0 0 0 0  Suicidal thoughts 0 0 0 0 0  PHQ-9 Score 15 7 0 0 0  Some recent data might be hidden   GAD 7 : Generalized Anxiety Score 08/03/2021 03/18/2021 02/05/2021 01/28/2021  Nervous, Anxious, on Edge 3 0 0 0  Control/stop worrying 3 3 0 0  Worry too much - different things 3 3 0 0  Trouble relaxing 3 3 0 0  Restless 3 0 0 0  Easily annoyed or irritable 3  3 0 0  Afraid - awful might happen 3 0 0 0  Total GAD 7 Score 21 12 0 0

## 2021-08-04 ENCOUNTER — Other Ambulatory Visit: Payer: Self-pay | Admitting: Nurse Practitioner

## 2021-08-04 MED ORDER — HYDROXYZINE PAMOATE 25 MG PO CAPS: 25.0000 mg | ORAL_CAPSULE | Freq: Three times a day (TID) | ORAL | 0 refills | Status: DC | PRN

## 2021-08-04 NOTE — BH Specialist Note (Signed)
Pt did not arrive to video visit and did not answer the phone; Left HIPPA-compliant message to call back Marlen Koman from [facility] at  [phone] (Kiona Blume's office).  ?; left MyChart message for patient.  ? ?

## 2021-08-04 NOTE — Progress Notes (Signed)
Shams Summer-Graves 20 y.o. Seeing inhouse behavioral health and requesting a refill of Vistaril again.  Sent to her pharmacy.  Nolene Bernheim, RN, MSN, NP-BC Nurse Practitioner, [facility] for [employer], [facility] 08/04/2021 7:25 PM

## 2021-08-05 ENCOUNTER — Telehealth: Payer: Self-pay | Admitting: Clinical

## 2021-08-05 NOTE — Telephone Encounter (Signed)
Call to number listed to confirm pt has picked up her medication;  female answered, said she was Hattie's mother, and that Lenae could be reached at [phone]. Call to second number reached voicemail;Left HIPPA-compliant message to call back Asher Muir from [employer] for [employer] at [facility] at  [phone] [facility] office).

## 2021-08-18 ENCOUNTER — Ambulatory Visit: Payer: Medicaid Other | Admitting: Clinical

## 2021-08-18 DIAGNOSIS — Z91199 Patient's noncompliance with other medical treatment and regimen due to unspecified reason: Secondary | ICD-10-CM

## 2021-10-20 ENCOUNTER — Encounter (HOSPITAL_COMMUNITY): Payer: Self-pay | Admitting: Emergency Medicine

## 2021-10-20 ENCOUNTER — Ambulatory Visit (HOSPITAL_COMMUNITY)
Admission: EM | Admit: 2021-10-20 | Discharge: 2021-10-20 | Disposition: A | Discharge status: Home / Self Care | Payer: Medicaid Other | Attending: Emergency Medicine | Admitting: Emergency Medicine

## 2021-10-20 DIAGNOSIS — R300 Dysuria: Secondary | ICD-10-CM | POA: Insufficient documentation

## 2021-10-20 DIAGNOSIS — R350 Frequency of micturition: Secondary | ICD-10-CM | POA: Diagnosis not present

## 2021-10-20 LAB — POCT URINALYSIS DIPSTICK, ED / UC
Bilirubin Urine: NEGATIVE
Glucose, UA: NEGATIVE mg/dL
Ketones, ur: NEGATIVE mg/dL
Nitrite: NEGATIVE
Protein, ur: NEGATIVE mg/dL
Specific Gravity, Urine: >=1.03 (ref 1.005–1.030)
Urobilinogen, UA: 0.2 mg/dL (ref 0.0–1.0)
pH: 6 (ref 5.0–8.0)

## 2021-10-20 MED ORDER — NITROFURANTOIN MONOHYD MACRO 100 MG PO CAPS: 100.0000 mg | ORAL_CAPSULE | Freq: Two times a day (BID) | ORAL | 0 refills | Status: DC

## 2021-10-20 NOTE — Discharge Instructions (Signed)
Your urinalysis is pending if positive you will be notified and antibiotics sent to the pharmacy at that time ? ?In the meantime you may use over-the-counter Azo which will help minimize the burning sensation ? ? ?Labs pending 2-3 days, you will be contacted if positive for any sti and treatment will be sent to the pharmacy, you will have to return to the clinic if positive for gonorrhea to receive treatment  ? ?Please refrain from having sex until labs results, if positive please refrain from having sex until treatment complete and symptoms resolve  ? ?If positive for Chlamydia  gonorrhea or trichomoniasis please notify partner or partners so they may tested as well ? ?Moving forward, it is recommended you use some form of protection against the transmission of sti infections  such as condoms or dental dams with each sexual encounter  ? ?

## 2021-10-20 NOTE — ED Triage Notes (Signed)
Pt is present today with dysuria and urine frequency. Pt sx started one week ago. Pt states that she would also like STD testing  ?

## 2021-10-20 NOTE — ED Provider Notes (Signed)
?[facility] ? ? ? ?CSN: [id number] ?Arrival date & time: 10/20/21  0957 ? ? ?  ? ?History   ?Chief Complaint ?Chief Complaint  ?Patient presents with  ? Dysuria  ? Urinary Frequency  ? ? ?HPI ?Rebekah Sanders is a 21 y.o. female.  ? ?Patient presents with dysuria, urinary frequency and urgency for 7 days.  Has not attempted treatment of symptoms.  Sexually active, 1 partner, no condom use.  No known exposure.  Denies hematuria abdominal pain or pressure, flank pain, fever, chills, vaginal discharge, itching or odor, new rash or lesions.  Currently on menses.  ? ?Past Medical History:  ?Diagnosis Date  ? ADHD (attention deficit hyperactivity disorder)   ? Asthma   ? Auditory hallucinations   ? Bipolar 1 disorder ([place])   ? Chlamydia   ? Gonorrhea   ? Oppositional defiant disorder   ? Trichomonosis   ? ? ?Patient Active Problem List  ? Diagnosis Date Noted  ? Family history of colon cancer 03/18/2021  ? Hemorrhoids 03/18/2021  ? Anxiety 03/18/2021  ? History of trichomoniasis 07/12/2019  ? Acne vulgaris 03/03/2018  ? OSA (obstructive sleep apnea) 05/26/2016  ? Presence of subdermal contraceptive device 08/21/2013  ? 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 ? ?Past Surgical History:  ?Procedure Laterality Date  ? TONSILLECTOMY AND ADENOIDECTOMY Bilateral 03/2016  ? ? ?OB History   ? ? Gravida  ?2  ? Para  ?2  ? Term  ?2  ? Preterm  ?   ? AB  ?   ? Living  ?2  ?  ? ? SAB  ?   ? IAB  ?   ? Ectopic  ?   ? Multiple  ?0  ? Live Births  ?2  ?   ?  ?  ? ? ? ?Home Medications   ? ?Prior to Admission medications   ?Medication Sig Start Date End Date Taking? Authorizing Provider  ?hydrOXYzine (VISTARIL) 25 MG capsule Take 1 capsule (25 mg total) by mouth 3 (three) times daily as needed. 08/04/21   Burleson, Rona Ravens, NP  ?ibuprofen (ADVIL) 600 MG tablet Take 1 tablet (600 mg total) by mouth every 6 (six) hours. ?Patient not taking: Reported on 03/18/2021 02/10/21   Gavin Pound, CNM   ?Prenatal Vit-Fe Fumarate-FA (PRENATAL COMPLETE) 14-0.4 MG TABS Take 1 tablet by mouth daily. ?Patient not taking: Reported on 03/18/2021 06/05/19   Charlann Lange, PA-C  ? ? ?Family History ?Family History  ?Problem Relation Age of Onset  ? Diabetes Mother   ? Healthy Father   ? Bipolar disorder Other   ?     Maternal grandparents  ? Depression Other   ?     Parents  ? Drug abuse Other   ?     multiple family members by report  ? ? ?Social History ?Social History  ? ?Tobacco Use  ? Smoking status: Former  ?  Types: Cigarettes, Cigars  ? Smokeless tobacco: Never  ? Tobacco comments:  ?  father smokes in home  ?Vaping Use  ? Vaping Use: Never used  ?Substance Use Topics  ? Alcohol use: Not Currently  ?  Comment: approx "a fifth of liquor" weekly on average  ? Drug use: Not Currently  ?  Types: Marijuana  ?  Comment: last used 06/22/20  ? ? ? ?Allergies   ?Penicillins ? ? ?Review of Systems ?Review of Systems  ?Constitutional: Negative.   ?Respiratory: Negative.    ?  Cardiovascular: Negative.   ?Genitourinary:  Positive for dysuria, frequency and urgency. Negative for decreased urine volume, difficulty urinating, dyspareunia, enuresis, flank pain, genital sores, hematuria, menstrual problem, pelvic pain, vaginal bleeding, vaginal discharge and vaginal pain.  ?Skin: Negative.   ?Neurological: Negative.   ? ? ?Physical Exam ?Triage Vital Signs ?ED Triage Vitals  ?Enc Vitals Group  ?   BP 10/20/21 1022 122/78  ?   Pulse Rate 10/20/21 1022 75  ?   Resp 10/20/21 1022 18  ?   Temp 10/20/21 1022 98.2 ?F (36.8 ?C)  ?   Temp Source 10/20/21 1022 Oral  ?   SpO2 10/20/21 1022 100 %  ?   Weight --   ?   Height --   ?   Head Circumference --   ?   Peak Flow --   ?   Pain Score 10/20/21 1021 6  ?   Pain Loc --   ?   Pain Edu? --   ?   Excl. in [place]? --   ? ?No data found. ? ?Updated Vital Signs ?BP 122/78   Pulse 75   Temp 98.2 ?F (36.8 ?C) (Oral)   Resp 18   SpO2 100%   Breastfeeding No  ? ?Visual Acuity ?Right Eye Distance:    ?Left Eye Distance:   ?Bilateral Distance:   ? ?Right Eye Near:   ?Left Eye Near:    ?Bilateral Near:    ? ?Physical Exam ?Constitutional:   ?   Appearance: Normal appearance.  ?HENT:  ?   Head: Normocephalic.  ?Eyes:  ?   Extraocular Movements: Extraocular movements intact.  ?Pulmonary:  ?   Effort: Pulmonary effort is normal.  ?Abdominal:  ?   General: Abdomen is flat. There is no distension.  ?   Palpations: Abdomen is soft.  ?   Tenderness: There is no abdominal tenderness. There is no right CVA tenderness or left CVA tenderness.  ?Genitourinary: ?   Comments: deferred ?Skin: ?   General: Skin is warm and dry.  ?Neurological:  ?   Mental Status: She is alert and oriented to person, place, and time. Mental status is at baseline.  ?Psychiatric:     ?   Mood and Affect: Mood normal.     ?   Behavior: Behavior normal.  ? ? ? ?UC Treatments / Results  ?Labs ?(all labs ordered are listed, but only abnormal results are displayed) ?Labs Reviewed  ?CERVICOVAGINAL ANCILLARY ONLY  ? ? ?EKG ? ? ?Radiology ?No results found. ? ?Procedures ?Procedures (including critical care time) ? ?Medications Ordered in UC ?Medications - No data to display ? ?Initial Impression / Assessment and Plan / UC Course  ?I have reviewed the triage vital signs and the nursing notes. ? ?Pertinent labs & imaging results that were available during my care of the patient were reviewed by me and considered in my medical decision making (see chart for details). ? ?Urinary frequency ?Dysuria ? ?Urinalysis showing hemoglobin and Rheana Casebolt blood cells, negative for nitrates, discussed with patient, will treat prophylactically based on symptoms while urine cultures, Macrobid 5-day course prescribed, STI labs pending, will treat per protocol, advised abstinence until all labs have results, symptoms have cleared and treatment is complete, may follow-up with urgent care as needed ?Final Clinical Impressions(s) / UC Diagnoses  ? ?Final diagnoses:  ?None   ? ?Discharge Instructions   ?None ?  ? ?ED Prescriptions   ?None ?  ? ?PDMP not reviewed this encounter. ?  ?  Hans Eden, NP ?10/20/21 1042 ? ?

## 2021-10-21 LAB — URINE CULTURE: Culture: NO GROWTH

## 2021-10-21 LAB — CERVICOVAGINAL ANCILLARY ONLY
Bacterial Vaginitis (gardnerella): POSITIVE — AB
Candida Glabrata: NEGATIVE
Candida Vaginitis: NEGATIVE
Chlamydia: POSITIVE — AB
Comment: NEGATIVE
Comment: NEGATIVE
Comment: NEGATIVE
Comment: NEGATIVE
Comment: NEGATIVE
Comment: NORMAL
Neisseria Gonorrhea: NEGATIVE
Trichomonas: NEGATIVE

## 2021-10-22 ENCOUNTER — Telehealth (HOSPITAL_COMMUNITY): Payer: Self-pay | Admitting: Emergency Medicine

## 2021-10-22 MED ORDER — DOXYCYCLINE HYCLATE 100 MG PO CAPS: 100.0000 mg | ORAL_CAPSULE | Freq: Two times a day (BID) | ORAL | 0 refills | Status: AC

## 2021-10-22 MED ORDER — METRONIDAZOLE 500 MG PO TABS: 500.0000 mg | ORAL_TABLET | Freq: Two times a day (BID) | ORAL | 0 refills | Status: DC

## 2022-01-04 ENCOUNTER — Encounter: Payer: Self-pay | Admitting: Family Medicine
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
## 2022-09-06 ENCOUNTER — Encounter (HOSPITAL_COMMUNITY): Payer: Self-pay

## 2022-09-06 ENCOUNTER — Ambulatory Visit (HOSPITAL_COMMUNITY)
Admission: RE | Admit: 2022-09-06 | Discharge: 2022-09-06 | Disposition: A | Discharge status: Home / Self Care | Payer: Medicaid Other | Source: Ambulatory Visit | Attending: Internal Medicine | Admitting: Internal Medicine

## 2022-09-06 VITALS — BP 138/85 | HR 98 | Temp 98.4°F | Resp 16

## 2022-09-06 DIAGNOSIS — N898 Other specified noninflammatory disorders of vagina: Secondary | ICD-10-CM | POA: Diagnosis present

## 2022-09-06 DIAGNOSIS — Z202 Contact with and (suspected) exposure to infections with a predominantly sexual mode of transmission: Secondary | ICD-10-CM | POA: Diagnosis not present

## 2022-09-06 NOTE — Discharge Instructions (Addendum)
Your STD testing has been sent to the lab and will come back in the next 2 to 3 days.  We will call you if any of your results are positive requiring treatment and treat you at that time.   If you do not receive a phone call from [place], this means your testing was negative.  Avoid sexual intercourse until your STD results come back.  If any of your STD results are positive, you will need to avoid sexual intercourse for 7 days while you are being treated to prevent spread of STD.  Condom use is the best way to prevent spread of STDs.  Return to urgent care as needed.

## 2022-09-06 NOTE — ED Triage Notes (Signed)
Patient reports that she has a "bump on her Left lower lip" and 3 bumps on my vagina. Patient states her vagina "smells like fish." Patient denies any vaginal discharge or abdominal pain.

## 2022-09-06 NOTE — ED Provider Notes (Signed)
[facility]    CSN: [id number] Arrival date & time: 09/06/22  1814      History   Chief Complaint Chief Complaint  Patient presents with   s74.5    HPI Rebekah Sanders is a 22 y.o. female.   Patient presents to urgent care for evaluation of "bump to the vagina" that she first noticed after she participated in unprotected sexual intercourse recently with a new female partner.  She is unsure if the bumps are red but denies pain and itching to the area.  She states she has had a foul odor to the vaginal area ever since intercourse with new partner.  Denies vaginal discharge, vaginal itching, urinary symptoms, and history of HSV-2.  No fever, chills, nausea, vomiting, abdominal pain, dizziness, known exposure to STD, or diarrhea.  No recent new creams or lotions to the vaginal area.  Denies recent antibiotic or steroid use.  She has not attempted use of any over-the-counter medicines before coming to urgent care to help with symptoms.     Past Medical History:  Diagnosis Date   ADHD (attention deficit hyperactivity disorder)    Asthma    Auditory hallucinations    Bipolar 1 disorder ([place])    Chlamydia    Gonorrhea    Oppositional defiant disorder    Trichomonosis     Patient Active Problem List   Diagnosis Date Noted   Family history of colon cancer 03/18/2021   Hemorrhoids 03/18/2021   Anxiety 03/18/2021   History of trichomoniasis 07/12/2019   Acne vulgaris 03/03/2018   OSA (obstructive sleep apnea) 05/26/2016   Presence of subdermal contraceptive device 08/21/2013   ADHD (attention deficit hyperactivity disorder), combined type 08/08/2012    Past Surgical History:  Procedure Laterality Date   TONSILLECTOMY AND ADENOIDECTOMY Bilateral 03/2016    OB History     Gravida  2   Para  2   Term  2   Preterm      AB      Living  2      SAB      IAB      Ectopic      Multiple  0   Live Births  2            Home Medications     Prior to Admission medications   Medication Sig Start Date End Date Taking? Authorizing Provider  hydrOXYzine (VISTARIL) 25 MG capsule Take 1 capsule (25 mg total) by mouth 3 (three) times daily as needed. 08/04/21   Burleson, Rona Ravens, NP  ibuprofen (ADVIL) 600 MG tablet Take 1 tablet (600 mg total) by mouth every 6 (six) hours. Patient not taking: Reported on 03/18/2021 02/10/21   Gavin Pound, CNM  metroNIDAZOLE (FLAGYL) 500 MG tablet Take 1 tablet (500 mg total) by mouth 2 (two) times daily. 10/22/21   LampteyMyrene Galas, MD  Prenatal Vit-Fe Fumarate-FA (PRENATAL COMPLETE) 14-0.4 MG TABS Take 1 tablet by mouth daily. Patient not taking: Reported on 03/18/2021 06/05/19   Charlann Lange, PA-C    Family History Family History  Problem Relation Age of Onset   Diabetes Mother    Healthy Father    Bipolar disorder Other        Maternal grandparents   Depression Other        Parents   Drug abuse Other        multiple family members by report    Social History Social History   Tobacco Use  Smoking status: Some Days    Types: Cigars   Smokeless tobacco: Never   Tobacco comments:    father smokes in home  Vaping Use   Vaping Use: Never used  Substance Use Topics   Alcohol use: Yes   Drug use: Not Currently    Types: Marijuana    Comment: last used 06/22/20     Allergies   Penicillins   Review of Systems Review of Systems Per HPI  Physical Exam Triage Vital Signs ED Triage Vitals  Enc Vitals Group     BP 09/06/22 1837 138/85     Pulse Rate 09/06/22 1837 98     Resp 09/06/22 1837 16     Temp 09/06/22 1837 98.4 F (36.9 C)     Temp Source 09/06/22 1837 Oral     SpO2 09/06/22 1837 97 %     Weight --      Height --      Head Circumference --      Peak Flow --      Pain Score 09/06/22 1839 0     Pain Loc --      Pain Edu? --      Excl. in [place]? --    No data found.  Updated Vital Signs BP 138/85 (BP Location: Left Arm)   Pulse 98   Temp 98.4 F (36.9 C)  (Oral)   Resp 16   LMP 08/30/2022 (Approximate)   SpO2 97%   Visual Acuity Right Eye Distance:   Left Eye Distance:   Bilateral Distance:    Right Eye Near:   Left Eye Near:    Bilateral Near:     Physical Exam Vitals and nursing note reviewed.  Constitutional:      Appearance: She is not ill-appearing or toxic-appearing.  HENT:     Head: Normocephalic and atraumatic.     Right Ear: Hearing, tympanic membrane, ear canal and external ear normal.     Left Ear: Hearing, tympanic membrane, ear canal and external ear normal.     Nose: Nose normal.     Mouth/Throat:     Lips: Pink.     Mouth: Mucous membranes are moist.     Pharynx: No posterior oropharyngeal erythema.  Eyes:     General: Lids are normal. Vision grossly intact. Gaze aligned appropriately.     Extraocular Movements: Extraocular movements intact.     Conjunctiva/sclera: Conjunctivae normal.  Cardiovascular:     Rate and Rhythm: Normal rate and regular rhythm.     Heart sounds: Normal heart sounds, S1 normal and S2 normal.  Pulmonary:     Effort: Pulmonary effort is normal. No respiratory distress.     Breath sounds: Normal breath sounds and air entry.  Genitourinary:    General: Normal vulva.     Vagina: No vaginal discharge.     Comments: I am unable to visualize any "bumps" or rash to the vaginal area that patient describes in HPI.  No evidence of vaginal discharge, skin excoriation, or swelling. Musculoskeletal:     Cervical back: Neck supple.  Skin:    General: Skin is warm and dry.     Capillary Refill: Capillary refill takes less than 2 seconds.     Findings: No rash.  Neurological:     General: No focal deficit present.     Mental Status: She is alert and oriented to person, place, and time. Mental status is at baseline.     Cranial Nerves: No dysarthria or  facial asymmetry.  Psychiatric:        Mood and Affect: Mood normal.        Speech: Speech normal.        Behavior: Behavior normal.         Thought Content: Thought content normal.        Judgment: Judgment normal.      UC Treatments / Results  Labs (all labs ordered are listed, but only abnormal results are displayed) Labs Reviewed  CERVICOVAGINAL ANCILLARY ONLY    EKG   Radiology No results found.  Procedures Procedures (including critical care time)  Medications Ordered in UC Medications - No data to display  Initial Impression / Assessment and Plan / UC Course  I have reviewed the triage vital signs and the nursing notes.  Pertinent labs & imaging results that were available during my care of the patient were reviewed by me and considered in my medical decision making (see chart for details).   1. Possible exposure to STD STI labs pending.  Patient declines HIV and syphilis testing today.  Will notify patient of positive results and treat accordingly when labs come back.  Patient to avoid sexual intercourse until screening testing comes back.  Education provided regarding safe sexual practices and patient encouraged to use protection to prevent spread of STIs.   Discussed physical exam and available lab work findings in clinic with patient.  Counseled patient regarding appropriate use of medications and potential side effects for all medications recommended or prescribed today. Discussed red flag signs and symptoms of worsening condition,when to call the PCP office, return to urgent care, and when to seek higher level of care in the emergency department. Patient verbalizes understanding and agreement with plan. All questions answered. Patient discharged in stable condition.    Final Clinical Impressions(s) / UC Diagnoses   Final diagnoses:  Vaginal odor  Possible exposure to STD     Discharge Instructions      Your STD testing has been sent to the lab and will come back in the next 2 to 3 days.  We will call you if any of your results are positive requiring treatment and treat you at that time.   If  you do not receive a phone call from [place], this means your testing was negative.  Avoid sexual intercourse until your STD results come back.  If any of your STD results are positive, you will need to avoid sexual intercourse for 7 days while you are being treated to prevent spread of STD.  Condom use is the best way to prevent spread of STDs.  Return to urgent care as needed.      ED Prescriptions   None    PDMP not reviewed this encounter.   Talbot Grumbling, [place] 09/06/22 1916

## 2022-09-08 ENCOUNTER — Telehealth (HOSPITAL_COMMUNITY): Payer: Self-pay | Admitting: Emergency Medicine

## 2022-09-08 LAB — CERVICOVAGINAL ANCILLARY ONLY
Bacterial Vaginitis (gardnerella): POSITIVE — AB
Candida Glabrata: NEGATIVE
Candida Vaginitis: POSITIVE — AB
Chlamydia: POSITIVE — AB
Comment: NEGATIVE
Comment: NEGATIVE
Comment: NEGATIVE
Comment: NEGATIVE
Comment: NEGATIVE
Comment: NORMAL
Neisseria Gonorrhea: NEGATIVE
Trichomonas: POSITIVE — AB

## 2022-09-08 MED ORDER — DOXYCYCLINE HYCLATE 100 MG PO CAPS: 100.0000 mg | ORAL_CAPSULE | Freq: Two times a day (BID) | ORAL | 0 refills | Status: AC

## 2022-09-08 MED ORDER — METRONIDAZOLE 500 MG PO TABS: 500.0000 mg | ORAL_TABLET | Freq: Two times a day (BID) | ORAL | 0 refills | Status: DC

## 2022-09-08 MED ORDER — FLUCONAZOLE 150 MG PO TABS: 150.0000 mg | ORAL_TABLET | Freq: Once | ORAL | 0 refills | Status: AC

## 2023-04-12 ENCOUNTER — Encounter (HOSPITAL_COMMUNITY): Payer: Self-pay | Admitting: *Deleted

## 2023-04-12 ENCOUNTER — Ambulatory Visit (HOSPITAL_COMMUNITY)
Admission: EM | Admit: 2023-04-12 | Discharge: 2023-04-12 | Disposition: A | Discharge status: Home / Self Care | Payer: Medicaid Other | Attending: Emergency Medicine | Admitting: Emergency Medicine

## 2023-04-12 DIAGNOSIS — N3001 Acute cystitis with hematuria: Secondary | ICD-10-CM | POA: Diagnosis present

## 2023-04-12 LAB — POCT URINALYSIS DIP (MANUAL ENTRY)
Bilirubin, UA: NEGATIVE
Glucose, UA: NEGATIVE mg/dL
Ketones, POC UA: NEGATIVE mg/dL
Nitrite, UA: POSITIVE — AB
Protein Ur, POC: NEGATIVE mg/dL
Spec Grav, UA: 1.02 (ref 1.010–1.025)
Urobilinogen, UA: 0.2 U/dL
pH, UA: 7 (ref 5.0–8.0)

## 2023-04-12 LAB — POCT URINE PREGNANCY: Preg Test, Ur: NEGATIVE

## 2023-04-12 MED ORDER — SULFAMETHOXAZOLE-TRIMETHOPRIM 800-160 MG PO TABS: 1.0000 | ORAL_TABLET | Freq: Two times a day (BID) | ORAL | 0 refills | Status: AC

## 2023-04-12 NOTE — ED Triage Notes (Signed)
Pt states she has urine incontinence and a strong smell to her urine X 3 months. She took cranberry tablets so she thought it was better but sx returned about a month.

## 2023-04-12 NOTE — ED Provider Notes (Signed)
[facility]    CSN: [id number] Arrival date & time: 04/12/23  1021      History   Chief Complaint Chief Complaint  Patient presents with   Urinary Frequency    HPI Rebekah Sanders is a 22 y.o. female.  About a month of urinary odor, and urgency/frequency sometimes causing urinary incontinence Not having dysuria, abd pain, flank pain, fever Has tried cranberry pills which usually work LMP unknown, has implant  Past Medical History:  Diagnosis Date   ADHD (attention deficit hyperactivity disorder)    Asthma    Auditory hallucinations    Bipolar 1 disorder ([facility])    Chlamydia    Gonorrhea    Oppositional defiant disorder    Trichomonosis     Patient Active Problem List   Diagnosis Date Noted   Family history of colon cancer 03/18/2021   Hemorrhoids 03/18/2021   Anxiety 03/18/2021   History of trichomoniasis 07/12/2019   Acne vulgaris 03/03/2018   OSA (obstructive sleep apnea) 05/26/2016   Presence of subdermal contraceptive device 08/21/2013   ADHD (attention deficit hyperactivity disorder), combined type 08/08/2012    Past Surgical History:  Procedure Laterality Date   TONSILLECTOMY AND ADENOIDECTOMY Bilateral 03/2016    OB History     Gravida  2   Para  2   Term  2   Preterm      AB      Living  2      SAB      IAB      Ectopic      Multiple  0   Live Births  2            Home Medications    Prior to Admission medications   Medication Sig Start Date End Date Taking? Authorizing Provider  sulfamethoxazole-trimethoprim (BACTRIM DS) 800-160 MG tablet Take 1 tablet by mouth 2 (two) times daily for 3 days. 04/12/23 04/15/23 Yes Romanda Turrubiates, Lurena Joiner, PA-C    Family History Family History  Problem Relation Age of Onset   Diabetes Mother    Healthy Father    Bipolar disorder Other        Maternal grandparents   Depression Other        Parents   Drug abuse Other        multiple family members by report    Social  History Social History   Tobacco Use   Smoking status: Some Days    Types: Cigars   Smokeless tobacco: Never   Tobacco comments:    father smokes in home  Vaping Use   Vaping status: Never Used  Substance Use Topics   Alcohol use: Yes   Drug use: Not Currently    Types: Marijuana    Comment: last used 06/22/20     Allergies   Penicillins   Review of Systems Review of Systems Per HPI  Physical Exam Triage Vital Signs ED Triage Vitals  Encounter Vitals Group     BP 04/12/23 1048 131/74     Systolic BP Percentile --      Diastolic BP Percentile --      Pulse Rate 04/12/23 1048 98     Resp 04/12/23 1048 18     Temp 04/12/23 1048 98.3 F (36.8 C)     Temp Source 04/12/23 1048 Oral     SpO2 04/12/23 1048 99 %     Weight --      Height --      Head Circumference --  Peak Flow --      Pain Score 04/12/23 1046 0     Pain Loc --      Pain Education --      Exclude from Growth Chart --    No data found.  Updated Vital Signs BP 131/74 (BP Location: Left Arm)   Pulse 98   Temp 98.3 F (36.8 C) (Oral)   Resp 18   SpO2 99%    Physical Exam Vitals and nursing note reviewed.  Constitutional:      Appearance: Normal appearance.  HENT:     Mouth/Throat:     Mouth: Mucous membranes are moist.     Pharynx: Oropharynx is clear.  Cardiovascular:     Rate and Rhythm: Normal rate and regular rhythm.     Heart sounds: Normal heart sounds.  Pulmonary:     Effort: Pulmonary effort is normal. No respiratory distress.     Breath sounds: Normal breath sounds.  Abdominal:     General: Bowel sounds are normal.     Palpations: Abdomen is soft.     Tenderness: There is no abdominal tenderness. There is no right CVA tenderness, left CVA tenderness, guarding or rebound.  Musculoskeletal:        General: Normal range of motion.  Skin:    General: Skin is warm and dry.  Neurological:     Mental Status: She is alert and oriented to person, place, and time.      UC  Treatments / Results  Labs (all labs ordered are listed, but only abnormal results are displayed) Labs Reviewed  POCT URINALYSIS DIP (MANUAL ENTRY) - Abnormal; Notable for the following components:      Result Value   Clarity, UA cloudy (*)    Blood, UA trace-intact (*)    Nitrite, UA Positive (*)    Leukocytes, UA Trace (*)    All other components within normal limits  URINE CULTURE  POCT URINE PREGNANCY  CERVICOVAGINAL ANCILLARY ONLY    EKG  Radiology No results found.  Procedures Procedures   Medications Ordered in UC Medications - No data to display  Initial Impression / Assessment and Plan / UC Course  I have reviewed the triage vital signs and the nursing notes.  Pertinent labs & imaging results that were available during my care of the patient were reviewed by me and considered in my medical decision making (see chart for details).  UPT negative UA with trace leuks, positive nitrates, trace RBCs.  Culture is pending.  Will treat for acute cystitis with Bactrim BID x 3 days Will also send off cytology swab to evaluate for other cause of odor Patient is agreeable to plan, no questions at this time  Final Clinical Impressions(s) / UC Diagnoses   Final diagnoses:  Acute cystitis with hematuria     Discharge Instructions      We will call you if anything on urine culture requires a change in therapy (about 2-3 days)  In the meantime I am treating you for a urinary tract infection. Please take the antibiotic as prescribed, with food to avoid upset stomach. Drink lots of fluids!  We will call you if anything on your vaginal swab returns positive. You can also see these results on MyChart. Please abstain from sexual intercourse until your results return.    ED Prescriptions     Medication Sig Dispense Auth. Provider   sulfamethoxazole-trimethoprim (BACTRIM DS) 800-160 MG tablet Take 1 tablet by mouth 2 (two) times daily  for 3 days. 6 tablet Ramaj Frangos, Lurena Joiner,  PA-C      PDMP not reviewed this encounter.   Aj Crunkleton, Lurena Joiner, [place] 04/12/23 1124

## 2023-04-12 NOTE — Discharge Instructions (Addendum)
We will call you if anything on urine culture requires a change in therapy (about 2-3 days)  In the meantime I am treating you for a urinary tract infection. Please take the antibiotic as prescribed, with food to avoid upset stomach. Drink lots of fluids!  We will call you if anything on your vaginal swab returns positive. You can also see these results on MyChart. Please abstain from sexual intercourse until your results return.

## 2023-04-13 ENCOUNTER — Telehealth: Payer: Self-pay

## 2023-04-13 MED ORDER — METRONIDAZOLE 500 MG PO TABS: 500.0000 mg | ORAL_TABLET | Freq: Two times a day (BID) | ORAL | 0 refills | Status: AC

## 2023-04-13 NOTE — Telephone Encounter (Signed)
Per protocol, pt requires tx with metronidazole. Attempted to reach patient x1. LVM.  Rx sent to pharmacy on file.

## 2023-04-15 LAB — URINE CULTURE: Culture: >=100000 — AB

## 2024-06-25 ENCOUNTER — Other Ambulatory Visit: Payer: Self-pay

## 2024-06-25 ENCOUNTER — Emergency Department (HOSPITAL_COMMUNITY)
Admission: EM | Admit: 2024-06-25 | Discharge: 2024-06-26 | Disposition: A | Payer: Self-pay | Attending: Emergency Medicine | Admitting: Emergency Medicine

## 2024-06-25 ENCOUNTER — Encounter (HOSPITAL_COMMUNITY): Payer: Self-pay | Admitting: *Deleted

## 2024-06-25 ENCOUNTER — Emergency Department (HOSPITAL_COMMUNITY): Payer: Self-pay

## 2024-06-25 DIAGNOSIS — N83201 Unspecified ovarian cyst, right side: Secondary | ICD-10-CM | POA: Insufficient documentation

## 2024-06-25 DIAGNOSIS — E860 Dehydration: Secondary | ICD-10-CM | POA: Insufficient documentation

## 2024-06-25 DIAGNOSIS — J45909 Unspecified asthma, uncomplicated: Secondary | ICD-10-CM | POA: Insufficient documentation

## 2024-06-25 DIAGNOSIS — N83209 Unspecified ovarian cyst, unspecified side: Secondary | ICD-10-CM

## 2024-06-25 DIAGNOSIS — R1031 Right lower quadrant pain: Secondary | ICD-10-CM

## 2024-06-25 LAB — URINALYSIS, ROUTINE W REFLEX MICROSCOPIC
Bilirubin Urine: NEGATIVE
Glucose, UA: NEGATIVE mg/dL
Hgb urine dipstick: NEGATIVE
Ketones, ur: 5 mg/dL — AB
Leukocytes,Ua: NEGATIVE
Nitrite: NEGATIVE
Protein, ur: 30 mg/dL — AB
Specific Gravity, Urine: 1.025 (ref 1.005–1.030)
pH: 7 (ref 5.0–8.0)

## 2024-06-25 LAB — COMPREHENSIVE METABOLIC PANEL WITH GFR
ALT: 16 U/L (ref 0–44)
AST: 20 U/L (ref 15–41)
Albumin: 3.9 g/dL (ref 3.5–5.0)
Alkaline Phosphatase: 30 U/L — ABNORMAL LOW (ref 38–126)
Anion gap: 10 (ref 5–15)
BUN: 7 mg/dL (ref 6–20)
CO2: 22 mmol/L (ref 22–32)
Calcium: 8.5 mg/dL — ABNORMAL LOW (ref 8.9–10.3)
Chloride: 103 mmol/L (ref 98–111)
Creatinine, Ser: 0.76 mg/dL (ref 0.44–1.00)
GFR, Estimated: >60 mL/min (ref >60)
Glucose, Bld: 107 mg/dL — ABNORMAL HIGH (ref 70–99)
Potassium: 3.8 mmol/L (ref 3.5–5.1)
Sodium: 135 mmol/L (ref 135–145)
Total Bilirubin: 0.9 mg/dL (ref 0.0–1.2)
Total Protein: 7.2 g/dL (ref 6.5–8.1)

## 2024-06-25 LAB — LIPASE, BLOOD: Lipase: 26 U/L (ref 11–51)

## 2024-06-25 LAB — CBC WITH DIFFERENTIAL/PLATELET
Abs Immature Granulocytes: 0.03 K/uL (ref 0.00–0.07)
Basophils Absolute: 0 K/uL (ref 0.0–0.1)
Basophils Relative: 0 %
Eosinophils Absolute: 0.1 K/uL (ref 0.0–0.5)
Eosinophils Relative: 1 %
HCT: 37.8 % (ref 36.0–46.0)
Hemoglobin: 13 g/dL (ref 12.0–15.0)
Immature Granulocytes: 0 %
Lymphocytes Relative: 31 %
Lymphs Abs: 3.1 K/uL (ref 0.7–4.0)
MCH: 33.8 pg (ref 26.0–34.0)
MCHC: 34.4 g/dL (ref 30.0–36.0)
MCV: 98.2 fL (ref 80.0–100.0)
Monocytes Absolute: 0.5 K/uL (ref 0.1–1.0)
Monocytes Relative: 5 %
Neutro Abs: 6.3 K/uL (ref 1.7–7.7)
Neutrophils Relative %: 63 %
Platelets: 274 K/uL (ref 150–400)
RBC: 3.85 MIL/uL — ABNORMAL LOW (ref 3.87–5.11)
RDW: 12.6 % (ref 11.5–15.5)
WBC: 10 K/uL (ref 4.0–10.5)
nRBC: 0 % (ref 0.0–0.2)

## 2024-06-25 LAB — HCG, SERUM, QUALITATIVE: Preg, Serum: NEGATIVE

## 2024-06-25 MED ORDER — LACTATED RINGERS IV BOLUS
1000.0000 mL | Freq: Once | INTRAVENOUS | Status: AC
  Administered 2024-06-26: 1000 mL via INTRAVENOUS

## 2024-06-25 MED ORDER — ONDANSETRON HCL 4 MG/2ML IJ SOLN
INTRAMUSCULAR | Status: AC
  Filled 2024-06-25: qty 2

## 2024-06-25 MED ORDER — IOHEXOL 350 MG/ML SOLN
75.0000 mL | Freq: Once | INTRAVENOUS | Status: AC | PRN
  Administered 2024-06-25: 75 mL via INTRAVENOUS

## 2024-06-25 MED ORDER — MORPHINE SULFATE (PF) 2 MG/ML IV SOLN
4.0000 mg | Freq: Once | INTRAVENOUS | Status: AC
  Administered 2024-06-25: 2 mg via INTRAVENOUS

## 2024-06-25 MED ORDER — MORPHINE SULFATE (PF) 2 MG/ML IV SOLN
INTRAVENOUS | Status: AC
  Filled 2024-06-25: qty 1

## 2024-06-25 MED ORDER — ONDANSETRON HCL 4 MG/2ML IJ SOLN
4.0000 mg | Freq: Once | INTRAMUSCULAR | Status: AC
  Administered 2024-06-25: 4 mg via INTRAVENOUS

## 2024-06-25 NOTE — ED Provider Triage Note (Signed)
 Emergency Medicine Provider Triage Evaluation Note  Rebekah Sanders , a 23 y.o. female  was evaluated in triage.  Pt complains of abd pain. Endorse nausea for several days, now with severe RLQ pain.  No fever, chills, cough, cp, sob, dysuria, vaginal bleeding or vaginal discharge.  No hx of ovarian cyst.  LMP 3 weeks ago  Review of Systems  Positive: As above Negative: As above  Physical Exam  BP 117/76 (BP Location: Left Arm)   Pulse 97   Temp 97.6 F (36.4 C)   Resp 20   SpO2 100%  Gen:   Awake, uncomfortable Resp:  Normal effort  MSK:   Moves extremities without difficulty  Other:    Medical Decision Making  Medically screening exam initiated at 8:37 PM.  Appropriate orders placed.  Rebekah Sanders was informed that the remainder of the evaluation will be completed by another provider, this initial triage assessment does not replace that evaluation, and the importance of remaining in the ED until their evaluation is complete.  Appy?   Rebekah Colon, PA-C 06/25/24 2039

## 2024-06-25 NOTE — ED Triage Notes (Signed)
 Patient stating her abdominal started hurting at 7pm. She states her RLQ is sharp and radiating to her butt. Reports nausea, vomiting. Denies diarrhea and fever.

## 2024-06-25 NOTE — ED Triage Notes (Signed)
 Pt having abd pain, worsening tonight. Started in right quadrant radiating into left quadrant with rectal pressure and vomiting. Pt very restless in triage.

## 2024-06-25 NOTE — ED Provider Notes (Signed)
 Care of patient received from prior provider at 1:49 AM, please see their note for complete H/P and care plan.  Received handoff per ED course.  Clinical Course as of 06/26/24 0149  Mon Jun 25, 2024  2334 Stable 23 YOF with a chief complaint NV RLQ pain CT benign. Pending US   Likely cyst rupture vs nonspecific MSK.  [CC]    Clinical Course User Index [CC] Jerral Meth, MD    Reassessment: Physiologic free fluid, multiple ovarian cyst.  We discussed this.  Patient stated she would call her obstetrician in the morning for follow-up for repeat imaging. Likely ruptured cyst or other benign pathology today.  No evidence of acute sinister pathology.  Patient ambulatory tolerating p.o. intake stable for outpatient care and management with supportive care.     Jerral Meth, MD 06/26/24 [phone]

## 2024-06-25 NOTE — ED Provider Notes (Signed)
 [facility] AT [facility] Provider Note   CSN: [id number] Arrival date & time: 06/25/24  2004     Patient presents with: Abdominal Pain   Rebekah Sanders is a 23 y.o. female.  History of ADHD, asthma, bipolar 1 disorder, ODD presenting with right lower quadrant abdominal pain that started this evening, sharp and radiating to her gluteal region.  History per patient.  Reports associated nausea and vomiting.  Denies diarrhea or fever.  LMP 3 weeks ago.  Denies dysuria, burning with urination, vaginal bleeding, or vaginal discharge.  Endorses the pain came on suddenly, however she was having nausea and vomiting since yesterday.  States that the pain primarily became in the periumbilical region and then went to her right lower quadrant, and has been radiating down to her gluteal region.  She states that if she attempts to bear down, that exacerbates the pain.  States that she has had pain like this in the past, however not to this severity.  Received morphine  and Zofran  in triage, improvement in pain on presentation.    Abdominal Pain      Prior to Admission medications   Not on File    Allergies: Penicillins    Review of Systems  Gastrointestinal:  Positive for abdominal pain.    Updated Vital Signs BP 120/82   Pulse 73   Temp 99.3 F (37.4 C) (Oral)   Resp 16   LMP 06/04/2024   SpO2 100%   Physical Exam Vitals and nursing note reviewed.  Constitutional:      General: She is not in acute distress.    Appearance: She is well-developed.  HENT:     Head: Normocephalic and atraumatic.  Eyes:     Conjunctiva/sclera: Conjunctivae normal.  Cardiovascular:     Rate and Rhythm: Normal rate and regular rhythm.     Heart sounds: Normal heart sounds. No murmur heard.    No gallop.  Pulmonary:     Effort: Pulmonary effort is normal. No respiratory distress.     Breath sounds: Normal breath sounds.  Abdominal:     General: Abdomen is flat.      Palpations: Abdomen is soft.     Tenderness: There is abdominal tenderness in the right lower quadrant, epigastric area and periumbilical area. There is no right CVA tenderness or left CVA tenderness. Positive signs include McBurney's sign. Negative signs include Murphy's sign and Rovsing's sign.  Musculoskeletal:        General: No swelling.     Cervical back: Neck supple.  Skin:    General: Skin is warm and dry.     Capillary Refill: Capillary refill takes less than 2 seconds.  Neurological:     Mental Status: She is alert and oriented to person, place, and time.  Psychiatric:        Mood and Affect: Mood normal.     (all labs ordered are listed, but only abnormal results are displayed) Labs Reviewed  CBC WITH DIFFERENTIAL/PLATELET - Abnormal; Notable for the following components:      Result Value   RBC 3.85 (*)    All other components within normal limits  COMPREHENSIVE METABOLIC PANEL WITH GFR - Abnormal; Notable for the following components:   Glucose, Bld 107 (*)    Calcium 8.5 (*)    Alkaline Phosphatase 30 (*)    All other components within normal limits  URINALYSIS, ROUTINE W REFLEX MICROSCOPIC - Abnormal; Notable for the following components:   APPearance CLOUDY (*)  Ketones, ur 5 (*)    Protein, ur 30 (*)    Bacteria, UA RARE (*)    All other components within normal limits  LIPASE, BLOOD  HCG, SERUM, QUALITATIVE    EKG: None  Radiology: CT ABDOMEN PELVIS W CONTRAST Result Date: 06/25/2024 CLINICAL DATA:  Right lower quadrant pain. EXAM: CT ABDOMEN AND PELVIS WITH CONTRAST TECHNIQUE: Multidetector CT imaging of the abdomen and pelvis was performed using the standard protocol following bolus administration of intravenous contrast. RADIATION DOSE REDUCTION: This exam was performed according to the departmental dose-optimization program which includes automated exposure control, adjustment of the mA and/or kV according to patient size and/or use of iterative  reconstruction technique. CONTRAST:  75mL OMNIPAQUE  IOHEXOL  350 MG/ML SOLN COMPARISON:  None Available. FINDINGS: Lower chest: Clear lung bases. Hepatobiliary: No focal liver abnormality is seen. No gallstones, gallbladder wall thickening, or biliary dilatation. Pancreas: No ductal dilatation or inflammation. Spleen: Normal in size without focal abnormality. Adrenals/Urinary Tract: Normal adrenal glands. No hydronephrosis or renal calculi. Homogeneous renal enhancement. Nondistended urinary bladder. Bladder wall thickening may be secondary to nondistention. Stomach/Bowel: Bowel evaluation is limited in the absence of enteric contrast. The appendix is normal, series 5, image 59. Allowing for limitations, no bowel obstruction or inflammatory change. Vascular/Lymphatic: Normal caliber abdominal aorta. Circumaortic left renal vein. Patent portal vein. No suspicious lymphadenopathy. Reproductive: Suboptimally assessed on the current exam. The uterus is heterogeneous. 3.2 cm cyst in the right adnexa may be minimally complex. 2.7 cm complex left adnexal cyst. Small amount of free fluid in the pelvis. Other: Small amount of pelvic free fluid. Mild stranding of the pelvic fat. No free air. Musculoskeletal: Sclerosis about the iliac aspect of both sacroiliac joints. No acute osseous findings. IMPRESSION: 1. Normal appendix. 2. Bilateral adnexal cysts, measuring 3.2 cm on the right and 2.7 cm on the left. Small amount of free fluid in the pelvis. Recommend pelvic ultrasound for further evaluation. 3. Bladder wall thickening may be secondary to nondistention, recommend correlation with urinalysis to exclude urinary tract infection. 4. Sclerosis about the iliac aspect of both sacroiliac joints, can be seen with osteitis condensans ilii. Electronically Signed   By: Andrea Gasman M.D.   On: 06/25/2024 23:20     Procedures   Medications Ordered in the ED  lactated ringers  bolus 1,000 mL (has no administration in time  range)  morphine  (PF) 2 MG/ML injection 4 mg (2 mg Intravenous Given 06/25/24 2045)  ondansetron  (ZOFRAN ) injection 4 mg (4 mg Intravenous Given 06/25/24 2045)  iohexol  (OMNIPAQUE ) 350 MG/ML injection 75 mL (75 mLs Intravenous Contrast Given 06/25/24 2253)    Clinical Course as of 06/26/24 0049  Mon Jun 25, 2024  2334 Stable 23 YOF with a chief complaint NV RLQ pain CT benign. Pending US   Likely cyst rupture vs nonspecific MSK.  [CC]    Clinical Course User Index [CC] Jerral Meth, MD                                 Medical Decision Making Amount and/or Complexity of Data Reviewed Radiology: ordered.   Based on patient presentation, history, evaluation, high suspicion for ruptured cyst of the right ovary, patient pending pelvic ultrasound to better appreciate.  Evidence of small amount of free fluid CT scan, overall appendix is reassuring, low suspicion for UTI versus appendicitis versus diverticulosis versus diverticulitis versus pregnancy versus ectopic pregnancy versus pancreatitis versus AKI versus significant electrolyte  derangement.  Patient with evidence of mild dehydration with ketones in urine, will provide fluids.  Pain overall significantly improved with morphine , nausea improved with Zofran .  At this time, patient is pending pelvic ultrasound to better appreciate and evaluate for possible ovarian torsion, overall likely plan for discharge if imaging overall reassuring.  Hemodynamically stable and pain well-controlled in the ED, and at time of signout.     Final diagnoses:  RLQ abdominal pain  Ruptured ovarian cyst    ED Discharge Orders     None          Arlee Katz, MD 06/26/24 9950    Tonia Chew, MD 06/30/24 [phone]

## 2024-06-26 MED ORDER — CELECOXIB 200 MG PO CAPS: 200.0000 mg | ORAL_CAPSULE | Freq: Two times a day (BID) | ORAL | 0 refills | Status: AC

## 2024-06-26 MED ORDER — KETOROLAC TROMETHAMINE 15 MG/ML IJ SOLN
30.0000 mg | Freq: Once | INTRAMUSCULAR | Status: AC
  Administered 2024-06-26: 30 mg via INTRAVENOUS
  Filled 2024-06-26: qty 2
